# Patient Record
Sex: Male | Born: 1985 | Race: White | Hispanic: No | Marital: Single | Smoking: Never smoker
Health system: Southern US, Community
[De-identification: ages and names within clinical notes are randomized; demographics above are authoritative.]

## PROBLEM LIST (undated history)

## (undated) ENCOUNTER — Encounter: Attending: Gastroenterology | Primary: Gastroenterology

## (undated) ENCOUNTER — Ambulatory Visit

## (undated) ENCOUNTER — Ambulatory Visit: Payer: PRIVATE HEALTH INSURANCE

## (undated) ENCOUNTER — Encounter

## (undated) ENCOUNTER — Ambulatory Visit: Payer: BLUE CROSS/BLUE SHIELD

## (undated) ENCOUNTER — Telehealth

## (undated) ENCOUNTER — Encounter: Payer: PRIVATE HEALTH INSURANCE | Attending: Gastroenterology | Primary: Gastroenterology

## (undated) ENCOUNTER — Encounter
Attending: Student in an Organized Health Care Education/Training Program | Primary: Student in an Organized Health Care Education/Training Program

## (undated) ENCOUNTER — Ambulatory Visit
Payer: BLUE CROSS/BLUE SHIELD | Attending: Student in an Organized Health Care Education/Training Program | Primary: Student in an Organized Health Care Education/Training Program

## (undated) ENCOUNTER — Ambulatory Visit: Payer: PRIVATE HEALTH INSURANCE | Attending: Gastroenterology | Primary: Gastroenterology

## (undated) ENCOUNTER — Telehealth: Attending: Gastroenterology | Primary: Gastroenterology

## (undated) ENCOUNTER — Ambulatory Visit: Payer: Medicaid (Managed Care) | Attending: Family Medicine | Primary: Family Medicine

## (undated) DIAGNOSIS — K5010 Crohn's disease of large intestine without complications: Secondary | ICD-10-CM

## (undated) HISTORY — DX: Crohn's disease of large intestine without complications: K50.10

---

## 2017-05-04 ENCOUNTER — Ambulatory Visit: Payer: BLUE CROSS/BLUE SHIELD | Admitting: Gastroenterology

## 2017-05-04 ENCOUNTER — Encounter: Payer: Self-pay | Admitting: Gastroenterology

## 2017-05-04 ENCOUNTER — Encounter (INDEPENDENT_AMBULATORY_CARE_PROVIDER_SITE_OTHER): Payer: Self-pay

## 2017-05-04 ENCOUNTER — Other Ambulatory Visit
Admission: RE | Admit: 2017-05-04 | Discharge: 2017-05-04 | Disposition: A | Discharge status: Home / Self Care | Payer: BLUE CROSS/BLUE SHIELD | Source: Ambulatory Visit | Attending: Gastroenterology | Admitting: Gastroenterology

## 2017-05-04 VITALS — BP 110/75 | HR 66 | Temp 97.5°F | Ht 74.0 in | Wt 218.2 lb

## 2017-05-04 DIAGNOSIS — Z114 Encounter for screening for human immunodeficiency virus [HIV]: Secondary | ICD-10-CM

## 2017-05-04 DIAGNOSIS — K529 Noninfective gastroenteritis and colitis, unspecified: Secondary | ICD-10-CM | POA: Diagnosis not present

## 2017-05-04 DIAGNOSIS — R1084 Generalized abdominal pain: Secondary | ICD-10-CM

## 2017-05-04 DIAGNOSIS — A0472 Enterocolitis due to Clostridium difficile, not specified as recurrent: Secondary | ICD-10-CM

## 2017-05-04 DIAGNOSIS — K523 Indeterminate colitis: Secondary | ICD-10-CM | POA: Insufficient documentation

## 2017-05-04 DIAGNOSIS — Z79899 Other long term (current) drug therapy: Secondary | ICD-10-CM | POA: Insufficient documentation

## 2017-05-04 LAB — CBC WITH DIFFERENTIAL/PLATELET
Basophils Absolute: 0.1 10*3/uL (ref 0–0.1)
Basophils Relative: 2 %
Eosinophils Absolute: 0.1 10*3/uL (ref 0–0.7)
Eosinophils Relative: 2 %
HEMATOCRIT: 43.9 % (ref 40.0–52.0)
HEMOGLOBIN: 14.5 g/dL (ref 13.0–18.0)
LYMPHS ABS: 1.2 10*3/uL (ref 1.0–3.6)
LYMPHS PCT: 19 %
MCH: 31.4 pg (ref 26.0–34.0)
MCHC: 33 g/dL (ref 32.0–36.0)
MCV: 95.2 fL (ref 80.0–100.0)
MONOS PCT: 8 %
Monocytes Absolute: 0.5 10*3/uL (ref 0.2–1.0)
NEUTROS ABS: 4.5 10*3/uL (ref 1.4–6.5)
NEUTROS PCT: 69 %
Platelets: 376 10*3/uL (ref 150–440)
RBC: 4.61 MIL/uL (ref 4.40–5.90)
RDW: 13.7 % (ref 11.5–14.5)
WBC: 6.5 10*3/uL (ref 3.8–10.6)

## 2017-05-04 LAB — COMPREHENSIVE METABOLIC PANEL
ALK PHOS: 57 U/L (ref 38–126)
ALT: 12 U/L — AB (ref 17–63)
AST: 18 U/L (ref 15–41)
Albumin: 3.9 g/dL (ref 3.5–5.0)
Anion gap: 9 (ref 5–15)
BUN: 11 mg/dL (ref 6–20)
CALCIUM: 9.1 mg/dL (ref 8.9–10.3)
CO2: 26 mmol/L (ref 22–32)
CREATININE: 0.87 mg/dL (ref 0.61–1.24)
Chloride: 104 mmol/L (ref 101–111)
GFR calc Af Amer: >60 mL/min (ref >60)
Glucose, Bld: 81 mg/dL (ref 65–99)
Potassium: 3.8 mmol/L (ref 3.5–5.1)
Sodium: 139 mmol/L (ref 135–145)
Total Bilirubin: 0.9 mg/dL (ref 0.3–1.2)
Total Protein: 7.3 g/dL (ref 6.5–8.1)

## 2017-05-04 LAB — C-REACTIVE PROTEIN: CRP: <0.8 mg/dL (ref <1.0)

## 2017-05-04 MED ORDER — FIDAXOMICIN 200 MG PO TABS: 200.0000 mg | ORAL_TABLET | Freq: Two times a day (BID) | ORAL | 0 refills | Status: AC

## 2017-05-04 NOTE — Progress Notes (Signed)
Jorge Bellows MD, MRCP(U.K) [address]  [place], [place] [address]  Main: [phone]  Fax: [phone]   Gastroenterology Consultation  Referring Provider:     Orlena Sheldon, MD Primary Care Physician:  Jorge Clark Primary Gastroenterologist:  Dr. Jonathon Clark  Reason for Consultation:     Recurrent C diff colitis        HPI:   Jorge Clark is a 31 y.o. y/o male   Past Medical History:  Diagnosis Date  . 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  He had over 200 pages of records to be reviewed and he also has an office note from April 2018 available on epic.  By himself he is well aware of his history and brought in all his records into his office visit and we discussed in detail.  His story goes that after graduation in December 2011- was 315 lbs , played college football, went to [place] in 2013 , was working on [employer] , half way through being out there, had some gut issues, diarrhea, non bloody . When he returned to [facility]. Saw his doctor at [facility] and he was treated with a round of metronidazole, symptoms resolved. Once he went off metronidazole the symptoms returned, had a second course of the antibiotics, symptoms resolved . Subsequently was found to have an elevated [place], had a colonoscopy which showed mild ulcerations in the colon and was placed on Asacol (2013). From there he says it went "downhill", symptoms were worse, Then tried remicaid, after a few rounds didn't work.  I do note that at one point he did have antibodies against Remicade. Was advised to see a physician at [facility], [place] the physicians there - there was a concern that he may have Crohn due to response to antibiotics. Increased dose of Remicaid, symptoms improved, he was on steroids and it got better.  Since he developedntibodies was switched to Humira (2014-2015). Was restarted on Metronidazole continuously  from 2015-2016 . Did well - was physically very active at that time. Spring of 2017 started having issues again .Started  Entyvio in 08/2016 because his liver function tests rose on Humira and at some point he was also treated with Uceris and methotrexate-he took [facility]  for a few months - no improvement . Marland Kitchen Did not have a colonoscopy after starting Entyvio.  Review of his records in April 2018 suggests that he was recommended a colonoscopy at that point of time. Stopped Entyvio 09/2016 - 3-4 rounds of treatment taken .  Subsequently was suggested to have a colectomy and as per his last office note it was strongly suggested that he undergo surgery as they felt he had refractory disease. At that time he was also doing a 24 hour endurance race. He said that he was feeling well - changed his diet , says that he felt his bowel movements were getting better. Surgery was put off. Prior to first episode of C diff - had 4 tests which were negative. Saw another GI at [facility]- had a repeat C diff test - was negative but was treated with Vancoymycin- symptoms got worse off the antibiotics. Went off Vancomycin for 2 weeks , back on vacomycin with a slow taper , then felt better, was then put on Flagyl felt better- was having formed stools. Attempted to go off the antibiotics, had worsening of symptoms to this date.  Since Entyvio not been on any treatment - last colonoscopy was 2017    His records from his last colonoscopy in May 2017 performed at [facility] suggest a diagnosis of ulcerative pancolitis and there was moderate diffuse inflammation of the entire examined colon biopsies showed moderate active chronic enterocolitis with focal erosion and reactive epithelial changes TB QuantiFERON test was negative in March 2017 hepatitis C virus antibody was negative hepatitis B core total antibody was negative hepatitis B surface antibody was reactive hepatitis B surface antigen was negative hemoglobin is 14.3 g. Biopsies in 2013  from an upper endoscopy show normal mucosa of the duodenum and chronic gastritis of the stomach.  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  A CT scan in 2013 demonstrated mild thickening of the terminal ileum and thickening of the colonic wall and could be seen in features of Crohn's disease or ulcerative colitis with backwash ileitis.  I do note that in 2017 there is has been on his medical records that he has been on methotrexate once a week subcutaneous injection as well as Uceris.  Records on epic from [facility] in April 2018 when he last saw his gastroenterologist   Present:   Having 10-12 bowel movements a day , no blood, no cramping , wakes up at night with a bowel movement .  Never had a MRE.   September this year was positive for C diff . Was not treated.    History reviewed. No pertinent surgical history.  Prior to Admission medications   Medication Sig Start Date End Date Taking? Authorizing Provider  Adalimumab (HUMIRA [place]) Inject  beneath the skin.    [provider]  Budesonide ER (UCERIS) 9 MG TB24 Take 1 Tab by Mouth. 05/26/15   [provider]  Budesonide ER 9 MG TB24 Take 9 mg by mouth. 07/22/15   [provider]  fidaxomicin (DIFICID) 200 MG TABS tablet Take 1 tablet (200 mg total) by mouth 2 (two) times daily for 14 days. 05/04/17 05/18/17  Jorge Bellows, MD  folic acid (FOLVITE) 1 MG tablet Take 1 mg by Mouth Once a Day. 07/22/15   [provider]  Methotrexate Sodium (METHOTREXATE, PF,) 200 MG/8ML injection INJECT 1 ML (25 MG TOTAL) UNDER THE SKIN ONCE A WEEK. DISPENSE 3ML SYRINGE #4 AND 25G 5/8" NEEDLE #4 04/10/15   [provider]  Multiple Vitamins-Minerals (MULTIVITAMIN PO) Take  by Mouth.    [provider]    History reviewed. No pertinent family  history.   Social History   Tobacco Use  . Smoking status: Never Smoker  . Smokeless tobacco: Never Used  Substance Use Topics  . Alcohol use: No    Frequency: Never  . Drug use: No    Allergies as of 05/04/2017  . (No Known Allergies)    Review of Systems:    All systems reviewed and negative except where noted in HPI.   Physical Exam:  BP 110/75 (BP Location: Left Arm, Patient Position: Sitting, Cuff Size: Large)   Pulse 66   Temp (!) 97.5 F (36.4 C) (Oral)   Ht 6' 2"  (1.88 m)   Wt 218 lb 3.2 oz (99 kg)   BMI 28.02 kg/m  No LMP for male patient. Psych:  Alert and cooperative. Normal mood and affect. General:   Alert,  Well-developed, well-nourished, pleasant and cooperative in  NAD Head:  Normocephalic and atraumatic. Eyes:  Sclera clear, no icterus.   Conjunctiva pink. Ears:  Normal auditory acuity. Nose:  No deformity, discharge, or lesions. Mouth:  No deformity or lesions,oropharynx pink & moist. Neck:  Supple; no masses or thyromegaly. Lungs:  Respirations even and unlabored.  Clear throughout to auscultation.   No wheezes, crackles, or rhonchi. No acute distress. Heart:  Regular rate and rhythm; no murmurs, clicks, rubs, or gallops. Abdomen:  Normal bowel sounds.  No bruits.  Soft, non-tender and non-distended without masses, hepatosplenomegaly or hernias noted.  No guarding or rebound tenderness.    Msk:  Symmetrical without gross deformities. Good, equal movement & strength bilaterally. Pulses:  Normal pulses noted. Extremities:  No clubbing or edema.  No cyanosis. Neurologic:  Alert and oriented x3;  grossly normal neurologically. Skin:  Intact without significant lesions or rashes. No jaundice. Lymph Nodes:  No significant cervical adenopathy. Psych:  Alert and cooperative. Normal mood and affect.  Imaging Studies: No results found.  Assessment and Plan:   Jorge Clark is a 31 y.o. y/o male here today self-referred for evaluation of recurrent C.  difficile colitis and ulcerative colitis.  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  It appears for a short time between March and April 2017 he received a few doses of Entyvio and for reasons unclear that it was stopped .   Based on the information is unclear whether the true failure of Entyvio or he had coexisting IBD-like symptoms such as bacterial overgrowth which caused significant symptoms of bloating diarrhea and abdominal discomfort.  He has responded to antibiotics on multiple occasions in the past, had several rounds of Flagyl and his symptoms have been predominant really bloating cramping and frequency of bowel movements he really has not had any rectal bleeding it is very likely possible that he does have chronic small bowel bacterial overgrowth syndrome along with ulcerative pancolitis.  He has been treated for C. difficile colitis on 2 occasions once in 2017 and once in 2018 he has subsequently had a repeat C. difficile test that has become positive in September 2018 and at present is having symptoms from moderate to severe in severity.  It is possible that his symptoms are a combination of small bowel bacterial overgrowth C. difficile colitis and possibly ulcerative colitis.  At this point of time it is hard to say which of these individual conditions is contributing to  total symptom burden.    Plan  1. Stop Probiotics as it can cause bloating and diarrhea 2. Course of Dificid to empirically treat C. difficile colitis, He also may benefit long-term from treatment with a new medication which is available to prevent the occurrence of C. difficile which is called Zimplava 3. Stool PCR and c diff 4. CBC,CMP,CRP,fecal lactoferrin , HIV,  Hepatitis A,B,C 5. MRE to determine if there is any small bowel inflammation and determine the level of inflammation of the colon 6. TBquantiferon 7.  I will see him back in 2 weeks standard at a mine timing of his colonoscopy as to assess the baseline of his mucosa to determine what poor proportion of his symptoms is from his colitis 8.  Based on his history it is  very likely possible that he has refractory disease and I have explained to him in detail that surgery may very likely be a possibility if this is all truly due to his inflammatory bowel disease I did mention to him that if surgery would be considered I would rather have it done electively rather than more emergently.  Based on the above tests I will get him in touch with the colorectal surgeon that he establishes a relationship with so that surgery were to be needed in the future it would be easier for him to go through it relatively soon a fashion.  This visit of 90 mins , over 50% of time  (60 minutes) was spent in counseling, coordinating care with the patient as mentioned above.       Follow up in 3 weeks   Dr Jorge Bellows MD,MRCP(U.K)

## 2017-05-05 ENCOUNTER — Other Ambulatory Visit
Admission: RE | Admit: 2017-05-05 | Discharge: 2017-05-05 | Disposition: A | Discharge status: Home / Self Care | Payer: BLUE CROSS/BLUE SHIELD | Source: Ambulatory Visit | Attending: Gastroenterology | Admitting: Gastroenterology

## 2017-05-05 DIAGNOSIS — R1084 Generalized abdominal pain: Secondary | ICD-10-CM | POA: Insufficient documentation

## 2017-05-05 LAB — GASTROINTESTINAL PANEL BY PCR, STOOL (REPLACES STOOL CULTURE)

## 2017-05-05 LAB — HEPATITIS B SURFACE ANTIBODY, QUANTITATIVE: HEPATITIS B-POST: 397.1 m[IU]/mL

## 2017-05-05 LAB — HIV ANTIBODY (ROUTINE TESTING W REFLEX): HIV Screen 4th Generation wRfx: NONREACTIVE

## 2017-05-05 LAB — HEPATITIS C ANTIBODY: HCV Ab: <0.1 s/co ratio (ref 0.0–0.9)

## 2017-05-05 LAB — HEPATITIS A ANTIBODY, TOTAL: Hep A Total Ab: NEGATIVE

## 2017-05-05 LAB — HEPATITIS B SURFACE ANTIGEN: HEP B S AG: NEGATIVE

## 2017-05-05 LAB — C DIFFICILE QUICK SCREEN W PCR REFLEX
C Diff antigen: NEGATIVE
C Diff interpretation: NOT DETECTED
C Diff toxin: NEGATIVE

## 2017-05-06 ENCOUNTER — Telehealth: Payer: Self-pay

## 2017-05-06 NOTE — Telephone Encounter (Signed)
Advised patient of results per Dr. Vicente Males.    - Inform the antigen and c diff toxin are negative- no benefit of deficid.  Pt to have 2 week follow-up to evaluate mucosa for colitis

## 2017-05-07 LAB — QUANTIFERON-TB GOLD PLUS (RQFGPL)
QUANTIFERON NIL VALUE: 0.02 [IU]/mL
QUANTIFERON TB2 AG VALUE: 0.02 [IU]/mL
QuantiFERON TB1 Ag Value: 0.01 IU/mL

## 2017-05-07 LAB — QUANTIFERON-TB GOLD PLUS: QUANTIFERON-TB GOLD PLUS: NEGATIVE

## 2017-05-10 ENCOUNTER — Encounter: Payer: Self-pay | Admitting: Gastroenterology

## 2017-05-10 ENCOUNTER — Telehealth: Payer: Self-pay | Admitting: Gastroenterology

## 2017-05-10 LAB — CALPROTECTIN, FECAL: Calprotectin, Fecal: 530 ug/g — ABNORMAL HIGH (ref 0–120)

## 2017-05-10 NOTE — Telephone Encounter (Signed)
Patient called wanting to know why his MRI got canceled. Please call and r/s.

## 2017-05-10 NOTE — Telephone Encounter (Signed)
[facility] called and needs Cdiff labs plus the patient needs try Vancomycin w/ in the past 30 days. Please call at [phone] Gae Bon. Please call STAT!!!

## 2017-05-11 ENCOUNTER — Ambulatory Visit: Payer: BLUE CROSS/BLUE SHIELD

## 2017-05-13 ENCOUNTER — Telehealth: Payer: Self-pay | Admitting: Gastroenterology

## 2017-05-13 NOTE — Telephone Encounter (Signed)
Patient left a voice message that he would like to talk to you. No reason given.

## 2017-05-16 ENCOUNTER — Other Ambulatory Visit: Payer: Self-pay

## 2017-05-16 DIAGNOSIS — R1084 Generalized abdominal pain: Secondary | ICD-10-CM

## 2017-05-16 MED ORDER — NA SULFATE-K SULFATE-MG SULF 17.5-3.13-1.6 GM/177ML PO SOLN: 1.0000 | Freq: Once | ORAL | 0 refills | Status: AC

## 2017-05-18 ENCOUNTER — Ambulatory Visit: Payer: BLUE CROSS/BLUE SHIELD | Admitting: Gastroenterology

## 2017-05-20 ENCOUNTER — Ambulatory Visit
Admission: RE | Admit: 2017-05-20 | Payer: BLUE CROSS/BLUE SHIELD | Source: Ambulatory Visit | Admitting: Gastroenterology

## 2017-05-20 ENCOUNTER — Ambulatory Visit
Admission: RE | Admit: 2017-05-20 | Discharge: 2017-05-20 | Disposition: A | Discharge status: Home / Self Care | Payer: BLUE CROSS/BLUE SHIELD | Source: Ambulatory Visit | Attending: Gastroenterology | Admitting: Gastroenterology

## 2017-05-20 ENCOUNTER — Other Ambulatory Visit: Payer: Self-pay

## 2017-05-20 DIAGNOSIS — R1084 Generalized abdominal pain: Secondary | ICD-10-CM | POA: Insufficient documentation

## 2017-05-20 MED ORDER — GADOBENATE DIMEGLUMINE 529 MG/ML IV SOLN
20.0000 mL | Freq: Once | INTRAVENOUS | Status: AC | PRN
  Administered 2017-05-20: 20 mL via INTRAVENOUS

## 2017-05-20 MED ORDER — PEG 3350-KCL-NA BICARB-NACL 420 G PO SOLR: 4000.0000 mL | Freq: Once | ORAL | 0 refills | Status: AC

## 2017-05-20 NOTE — Telephone Encounter (Signed)
Sent MyChart message requesting the office to contact at [facility] for the colonoscopy prep.   Since this message is confusing I sent the prep to the pharmacy and responded to the Message with that information and the phone number to call concerning the time of procedure at [facility].

## 2017-05-23 ENCOUNTER — Ambulatory Visit: Payer: BLUE CROSS/BLUE SHIELD | Admitting: Certified Registered"

## 2017-05-23 ENCOUNTER — Encounter
Admission: RE | Disposition: A | Discharge status: Home / Self Care | Payer: Self-pay | Source: Ambulatory Visit | Attending: Gastroenterology

## 2017-05-23 ENCOUNTER — Ambulatory Visit
Admission: RE | Admit: 2017-05-23 | Discharge: 2017-05-23 | Disposition: A | Discharge status: Home / Self Care | Payer: BLUE CROSS/BLUE SHIELD | Source: Ambulatory Visit | Attending: Gastroenterology | Admitting: Gastroenterology

## 2017-05-23 DIAGNOSIS — Z79899 Other long term (current) drug therapy: Secondary | ICD-10-CM | POA: Diagnosis not present

## 2017-05-23 DIAGNOSIS — K5100 Ulcerative (chronic) pancolitis without complications: Secondary | ICD-10-CM | POA: Diagnosis not present

## 2017-05-23 DIAGNOSIS — K635 Polyp of colon: Secondary | ICD-10-CM | POA: Insufficient documentation

## 2017-05-23 DIAGNOSIS — K5190 Ulcerative colitis, unspecified, without complications: Secondary | ICD-10-CM | POA: Insufficient documentation

## 2017-05-23 DIAGNOSIS — K6289 Other specified diseases of anus and rectum: Secondary | ICD-10-CM | POA: Diagnosis not present

## 2017-05-23 DIAGNOSIS — R1084 Generalized abdominal pain: Secondary | ICD-10-CM

## 2017-05-23 HISTORY — PX: COLONOSCOPY WITH PROPOFOL: SHX5780

## 2017-05-23 SURGERY — COLONOSCOPY WITH PROPOFOL
Anesthesia: General

## 2017-05-23 MED ORDER — GLYCOPYRROLATE 0.2 MG/ML IJ SOLN
INTRAMUSCULAR | Status: AC
  Filled 2017-05-23: qty 1

## 2017-05-23 MED ORDER — MIDAZOLAM HCL 5 MG/5ML IJ SOLN
INTRAMUSCULAR | Status: DC | PRN
  Administered 2017-05-23: 2 mg via INTRAVENOUS

## 2017-05-23 MED ORDER — PROPOFOL 10 MG/ML IV BOLUS
INTRAVENOUS | Status: DC | PRN
  Administered 2017-05-23: 30 mg via INTRAVENOUS
  Administered 2017-05-23: 70 mg via INTRAVENOUS
  Administered 2017-05-23: 50 mg via INTRAVENOUS

## 2017-05-23 MED ORDER — SODIUM CHLORIDE 0.9 % IV SOLN
INTRAVENOUS | Status: DC
  Administered 2017-05-23: 1000 mL via INTRAVENOUS

## 2017-05-23 MED ORDER — PROPOFOL 500 MG/50ML IV EMUL
INTRAVENOUS | Status: DC | PRN
  Administered 2017-05-23: 120 ug/kg/min via INTRAVENOUS

## 2017-05-23 MED ORDER — LIDOCAINE 2% (20 MG/ML) 5 ML SYRINGE
INTRAMUSCULAR | Status: DC | PRN
  Administered 2017-05-23: 25 mg via INTRAVENOUS

## 2017-05-23 MED ORDER — MIDAZOLAM HCL 2 MG/2ML IJ SOLN
INTRAMUSCULAR | Status: AC
  Filled 2017-05-23: qty 2

## 2017-05-23 MED ORDER — GLYCOPYRROLATE 0.2 MG/ML IJ SOLN
INTRAMUSCULAR | Status: DC | PRN
  Administered 2017-05-23: 0.2 mg via INTRAVENOUS

## 2017-05-23 MED ORDER — LIDOCAINE HCL (PF) 1 % IJ SOLN
INTRAMUSCULAR | Status: AC
  Administered 2017-05-23: 0.3 mL via INTRADERMAL
  Filled 2017-05-23: qty 2

## 2017-05-23 MED ORDER — LIDOCAINE HCL (PF) 1 % IJ SOLN
2.0000 mL | Freq: Once | INTRAMUSCULAR | Status: AC
  Administered 2017-05-23: 0.3 mL via INTRADERMAL

## 2017-05-23 NOTE — Anesthesia Post-op Follow-up Note (Signed)
Anesthesia QCDR form completed.        

## 2017-05-23 NOTE — Anesthesia Postprocedure Evaluation (Signed)
Anesthesia Post Note  Patient: Jorge Clark  Procedure(s) Performed: COLONOSCOPY WITH PROPOFOL (N/A )  Patient location during evaluation: PACU Anesthesia Type: General Level of consciousness: awake and alert Pain management: pain level controlled Vital Signs Assessment: post-procedure vital signs reviewed and stable Respiratory status: spontaneous breathing, nonlabored ventilation, respiratory function stable and patient connected to nasal cannula oxygen Cardiovascular status: blood pressure returned to baseline and stable Postop Assessment: no apparent nausea or vomiting Anesthetic complications: no     Last Vitals:  Vitals:   05/23/17 0949 05/23/17 0953  BP:  105/73  Pulse: (!) 52 (!) 45  Resp: 16 10  Temp:    SpO2: 100% 100%    Last Pain:  Vitals:   05/23/17 0953  TempSrc:   PainSc: 0-No pain                 Molli Barrows

## 2017-05-23 NOTE — Op Note (Signed)
[facility] Gastroenterology Patient Name: Jorge Clark Procedure Date: 05/23/2017 8:55 AM MRN: [id number] Account #: [id number] Date of Birth: 04/11/1986 Admit Type: Outpatient Age: 31 Room: [facility] ENDO ROOM 4 Gender: Male Note Status: Finalized Procedure:            Colonoscopy Indications:          Chronic ulcerative pancolitis Providers:            Jonathon Bellows MD, MD Referring MD:         No Local Md, MD (Referring MD) Medicines:            Monitored Anesthesia Care Complications:        No immediate complications. Procedure:            Pre-Anesthesia Assessment:                       - ASA Grade Assessment: III - A patient with severe                        systemic disease.                       After obtaining informed consent, the colonoscope was                        passed under direct vision. Throughout the procedure,                        the patient's blood pressure, pulse, and oxygen                        saturations were monitored continuously. The                        Colonoscope was introduced through the anus and                        advanced to the the terminal ileum. The colonoscopy was                        performed with ease. The patient tolerated the                        procedure well. The quality of the bowel preparation                        was good. Findings:      The terminal ileum appeared normal. Biopsies were taken with a cold       forceps for histology.      Inflammation characterized by congestion (edema), erosions, erythema,       loss of vascularity and mucus was found in a continuous and       circumferential pattern from the rectum to the cecum. This was moderate       in severity. Biopsies were taken with a cold forceps for histology.      A 10 mm polyp was found in the sigmoid colon. The polyp was sessile.       Biopsies were taken with a cold forceps for histology. Impression:           - The examined  portion of the ileum  was normal.                        Biopsied.                       - Pancolitis ulcerative colitis. Inflammation was found                        from the rectum to the cecum. This was moderate in                        severity. Biopsied.                       - One 10 mm polyp in the sigmoid colon. Biopsied. Recommendation:       - Discharge patient to home (with escort).                       - Resume previous diet.                       - Continue present medications.                       - Await pathology results.                       - Return to my office in 2 weeks. Procedure Code(s):    --- Professional ---                       [phone], Colonoscopy, flexible; with biopsy, single or                        multiple Diagnosis Code(s):    --- Professional ---                       K51.00, Ulcerative (chronic) pancolitis without                        complications                       D12.5, Benign neoplasm of sigmoid colon CPT copyright 2016 American Medical Association. All rights reserved. The codes documented in this report are preliminary and upon coder review may  be revised to meet current compliance requirements. Jonathon Bellows, MD Jonathon Bellows MD, MD 05/23/2017 9:24:21 AM This report has been signed electronically. Number of Addenda: 0 Note Initiated On: 05/23/2017 8:55 AM Total Procedure Duration: 0 hours 20 minutes 51 seconds       [facility]

## 2017-05-23 NOTE — Anesthesia Preprocedure Evaluation (Signed)
Anesthesia Evaluation  Patient identified by MRN, date of birth, ID band Patient awake    Reviewed: Allergy & Precautions, H&P , NPO status , Patient's Chart, lab work & pertinent test results, reviewed documented beta blocker date and time   Airway Mallampati: II   Neck ROM: full    Dental  (+) Poor Dentition, Teeth Intact   Pulmonary neg pulmonary ROS, neg recent URI,    Pulmonary exam normal        Cardiovascular Exercise Tolerance: Good negative cardio ROS Normal cardiovascular exam Rhythm:regular Rate:Normal     Neuro/Psych negative neurological ROS  negative psych ROS   GI/Hepatic negative GI ROS, Neg liver ROS,   Endo/Other  negative endocrine ROS  Renal/GU negative Renal ROS  negative genitourinary   Musculoskeletal   Abdominal   Peds  Hematology negative hematology ROS (+)   Anesthesia Other Findings Past Medical History: No date: Crohn's colitis ([place]) No past surgical history on file.   Reproductive/Obstetrics negative OB ROS                             Anesthesia Physical Anesthesia Plan  ASA: II  Anesthesia Plan: General   Post-op Pain Management:    Induction:   PONV Risk Score and Plan:   Airway Management Planned:   Additional Equipment:   Intra-op Plan:   Post-operative Plan:   Informed Consent: I have reviewed the patients History and Physical, chart, labs and discussed the procedure including the risks, benefits and alternatives for the proposed anesthesia with the patient or authorized representative who has indicated his/her understanding and acceptance.   Dental Advisory Given  Plan Discussed with: CRNA  Anesthesia Plan Comments:         Anesthesia Quick Evaluation

## 2017-05-23 NOTE — H&P (Signed)
     Jonathon Bellows, MD [address] [place], [place], [place], [address] [address], [place], [place], [place], [address] Phone: [phone]  Fax: [phone]  Primary Care Physician:  Evette Cristal A   Pre-Procedure History & Physical: HPI:  Jorge Clark is a 31 y.o. male is here for an colonoscopy.   Past Medical History:  Diagnosis Date  . Crohn's colitis ([place])     No past surgical history on file.  Prior to Admission medications   Medication Sig Start Date End Date Taking? Authorizing Provider  Adalimumab (HUMIRA [place]) Inject  beneath the skin.    [provider]  Budesonide ER (UCERIS) 9 MG TB24 Take 1 Tab by Mouth. 05/26/15   [provider]  Budesonide ER 9 MG TB24 Take 9 mg by mouth. 07/22/15   [provider]  folic acid (FOLVITE) 1 MG tablet Take 1 mg by Mouth Once a Day. 07/22/15   [provider]  Methotrexate Sodium (METHOTREXATE, PF,) 200 MG/8ML injection INJECT 1 ML (25 MG TOTAL) UNDER THE SKIN ONCE A WEEK. DISPENSE 3ML SYRINGE #4 AND 25G 5/8" NEEDLE #4 04/10/15   [provider]  Multiple Vitamins-Minerals (MULTIVITAMIN PO) Take  by Mouth.    [provider]    Allergies as of 05/17/2017  . (No Known Allergies)    No family history on file.  Social History   Socioeconomic History  . Marital status: Single    Spouse name: Not on file  . Number of children: Not on file  . Years of education: Not on file  . Highest education level: Not on file  Social Needs  . Financial resource strain: Not on file  . Food insecurity - worry: Not on file  . Food insecurity - inability: Not on file  . Transportation needs - medical: Not on file  . Transportation needs - non-medical: Not on file  Occupational History  . Not on file  Tobacco Use  . Smoking status: Never Smoker  . Smokeless tobacco: Never Used  Substance and Sexual Activity  . Alcohol use: No    Frequency: Never  . Drug use: No  . Sexual  activity: Yes  Other Topics Concern  . Not on file  Social History Narrative  . Not on file    Review of Systems: See HPI, otherwise negative ROS  Physical Exam: There were no vitals taken for this visit. General:   Alert,  pleasant and cooperative in NAD Head:  Normocephalic and atraumatic. Neck:  Supple; no masses or thyromegaly. Lungs:  Clear throughout to auscultation, normal respiratory effort.    Heart:  +S1, +S2, Regular rate and rhythm, No edema. Abdomen:  Soft, nontender and nondistended. Normal bowel sounds, without guarding, and without rebound.   Neurologic:  Alert and  oriented x4;  grossly normal neurologically.  Impression/Plan: Jorge Clark is here for an colonoscopy to be performed for evaluation of ulcerative colitis.    Risks, benefits, limitations, and alternatives regarding  colonoscopy have been reviewed with the patient.  Questions have been answered.  All parties agreeable.   Jonathon Bellows, MD  05/23/2017, 8:33 AM

## 2017-05-23 NOTE — Transfer of Care (Signed)
Immediate Anesthesia Transfer of Care Note  Patient: Jorge Clark  Procedure(s) Performed: COLONOSCOPY WITH PROPOFOL (N/A )  Patient Location: Endoscopy Unit  Anesthesia Type:General  Level of Consciousness: awake and alert   Airway & Oxygen Therapy: Patient Spontanous Breathing and Patient connected to nasal cannula oxygen  Post-op Assessment: Report given to RN and Post -op Vital signs reviewed and stable  Post vital signs: Reviewed  Last Vitals:  Vitals:   05/23/17 0835 05/23/17 0928  BP: 128/81 103/62  Pulse: 68 77  Resp: 16 15  Temp: (!) 35.8 C (!) 36.1 C  SpO2: 97% 100%    Last Pain:  Vitals:   05/23/17 0835  TempSrc: Tympanic         Complications: No apparent anesthesia complications

## 2017-05-24 ENCOUNTER — Encounter: Payer: Self-pay | Admitting: Gastroenterology

## 2017-05-25 LAB — SURGICAL PATHOLOGY

## 2017-05-26 ENCOUNTER — Encounter: Payer: Self-pay | Admitting: Gastroenterology

## 2017-06-01 ENCOUNTER — Telehealth: Payer: Self-pay

## 2017-06-01 ENCOUNTER — Ambulatory Visit: Payer: BLUE CROSS/BLUE SHIELD | Admitting: Gastroenterology

## 2017-06-01 NOTE — Telephone Encounter (Signed)
-----   Message from Jonathon Bellows, MD sent at 05/31/2017  5:29 PM EST ----- Sharyn Lull - I think patient may already have the results- he has moderate to severe ulcerative colitis based on these results- will discuss next steps on 06/02/17

## 2017-06-01 NOTE — Telephone Encounter (Signed)
Pt has been informed that he has moderate to severe ulcerative colitis and Dr. Vicente Males will discuss the next steps at [place] visit.  Thanks [employer]

## 2017-06-02 ENCOUNTER — Encounter: Payer: Self-pay | Admitting: Gastroenterology

## 2017-06-02 ENCOUNTER — Ambulatory Visit (INDEPENDENT_AMBULATORY_CARE_PROVIDER_SITE_OTHER): Payer: BLUE CROSS/BLUE SHIELD | Admitting: Gastroenterology

## 2017-06-02 VITALS — BP 115/78 | HR 70 | Temp 98.2°F | Ht 74.0 in | Wt 224.6 lb

## 2017-06-02 DIAGNOSIS — K580 Irritable bowel syndrome with diarrhea: Secondary | ICD-10-CM | POA: Diagnosis not present

## 2017-06-02 DIAGNOSIS — K5100 Ulcerative (chronic) pancolitis without complications: Secondary | ICD-10-CM | POA: Diagnosis not present

## 2017-06-02 MED ORDER — RIFAXIMIN 550 MG PO TABS: 550.0000 mg | ORAL_TABLET | Freq: Three times a day (TID) | ORAL | 0 refills | Status: DC

## 2017-06-02 NOTE — Progress Notes (Signed)
xifaxan  Jonathon Bellows MD, MRCP(U.K) [address]  [place]  [place], [place] [address]  Main: [phone]  Fax: [phone]   Primary Care Physician: Orlena Sheldon  Primary Gastroenterologist:  Dr. Jonathon Bellows   Chief Complaint  Patient presents with  . Ulcerative Colitis    follow up    HPI: Jorge Clark is a 31 y.o. male    Summary of history : He saw me for his first visit on 05/04/17 :   He had over 200 pages of records to be reviewed and he also has an office note from April 2018 available on epic.  By himself he is well aware of his history and brought in all his records into his office visit and we discussed in detail.  His story goes that after graduation in December 2011- was 315 lbs , played college football, went to [place] in 2013 , was working on [employer] , half way through being out there, had some gut issues, diarrhea, non bloody . When he returned to [facility] Saw his doctor at [facility] and he was treated with a round of metronidazole, symptoms resolved. Once he went off metronidazole the symptoms returned, had a second course of the antibiotics, symptoms resolved . Subsequently was found to have an elevated [place], had a colonoscopy which showed mild ulcerations in the colon and was placed on Asacol (2013). From there he says it went "downhill", symptoms were worse, Then tried remicaid, after a few rounds didn't work.  I do note that at one point he did have antibodies against Remicade. Was advised to see a physician at [facility], [place] the physicians there - there was a concern that he may have Crohn due to response to antibiotics. Increased dose of Remicaid, symptoms improved, he was on steroids and it got better.  Since he developedntibodies was switched to Humira (2014-2015). Was restarted on Metronidazole continuously from 2015-2016 . Did well - was physically very active at that time. Spring of 2017 started having issues again .Started  Entyvio in 08/2016 because his liver  function tests rose on Humira and at some point he was also treated with Uceris and methotrexate-he took [facility]  for a few months - no improvement . Marland Kitchen Did not have a colonoscopy after starting Entyvio.  Review of his records in April 2018 suggests that he was recommended a colonoscopy at that point of time. Stopped Entyvio 09/2016 - 3-4 rounds of treatment taken .  Subsequently was suggested to have a colectomy and as per his last office note it was strongly suggested that he undergo surgery as they felt he had refractory disease. At that time he was also doing a 24 hour endurance race. He said that he was feeling well - changed his diet , says that he felt his bowel movements were getting better. Surgery was put off. Prior to first episode of C diff - had 4 tests which were negative. Saw another GI at [facility]- had a repeat C diff test - was negative but was treated with Vancoymycin- symptoms got worse off the antibiotics. Went off Vancomycin for 2 weeks , back on vacomycin with a slow taper , then felt better, was then put on Flagyl felt better- was having formed stools. Attempted to go off the antibiotics, had worsening of symptoms to this date. Since Entyvio not been on any treatment - last colonoscopy was 2017    His records from his last colonoscopy in May 2017 performed at [facility] suggest  a diagnosis of ulcerative pancolitis and there was moderate diffuse inflammation of the entire examined colon biopsies showed moderate active chronic enterocolitis with focal erosion and reactive epithelial changes TB QuantiFERON test was negative in March 2017 hepatitis C virus antibody was negative hepatitis B core total antibody was negative hepatitis B surface antibody was reactive hepatitis B surface antigen was negative hemoglobin is 14.3 g. Biopsies in 2013 from an upper endoscopy show normal mucosa of the duodenum and chronic gastritis of the stomach.  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  A CT scan in 2013 demonstrated mild thickening of the terminal ileum and thickening of the colonic wall and could be seen in features of Crohn's disease or ulcerative colitis with backwash ileitis.  I do note that in 2017 there is has been on his medical records that he has been on methotrexate once a week subcutaneous injection as well as Uceris.  Records on epic from [facility] in April 2018 when he last saw his gastroenterologist   Interval history   05/04/2017-  06/02/2017    05/23/17- Colonoscopy showed pancolitis-biopsies confirm moderate severe  05/20/17: MR enterogram showed normal small bowel but thickened colol difusely from rectum to cecum, no skip areas.  05/04/17- Stool C diff and PCR negative. TB quantiferon negative. CMP-normal  HCV ab ,Hep Bsag,HIV,CRP-negative.Immune to hep B.not immune to Hep A.Hb 14.5 grams.   Xifaxan helped significantly with improvement of diarrhea.   No current outpatient medications on file.   No current facility-administered medications for this visit.     Allergies as of 06/02/2017  . (No Known Allergies)    ROS:  General: Negative for anorexia, weight loss, fever, chills, fatigue, weakness. ENT: Negative for hoarseness, difficulty swallowing , nasal congestion. CV: Negative for chest pain, angina, palpitations, dyspnea on exertion, peripheral edema.  Respiratory: Negative for dyspnea at rest, dyspnea on exertion, cough, sputum, wheezing.  GI: See history of present illness. GU:  Negative for dysuria, hematuria, urinary incontinence, urinary frequency, nocturnal urination.  Endo: Negative for unusual weight change.    Physical Examination:   BP 115/78   Pulse 70   Temp 98.2 F (36.8 C) (Oral)   Ht 6' 2"  (1.88 m)   Wt 224 lb 9.6 oz (101.9 kg)   BMI 28.84 kg/m   General:  Well-nourished, well-developed in no acute distress.  Eyes: No icterus. Conjunctivae pink. Mouth: Oropharyngeal mucosa moist and pink , no lesions erythema or exudate. Lungs: Clear to auscultation bilaterally. Non-labored. Heart: Regular rate and rhythm, no murmurs rubs or gallops.  Abdomen: Bowel sounds are normal, nontender, nondistended, no hepatosplenomegaly or masses, no abdominal bruits or hernia , no rebound or guarding.   Extremities: No lower extremity edema. No clubbing or deformities. Neuro: Alert and oriented x 3.  Grossly intact. Skin: Warm and dry, no jaundice.   Psych: Alert and cooperative, normal mood and affect.   Imaging Studies: Mr Kerry Fort Abdomen W Wo Contrast  Result Date: 05/20/2017 CLINICAL DATA:  Chronic diarrhea for 6 years with suspicion for inflammatory bowel disease, prior colonoscopies at [facility] suggest ulcerative pain colitis. Chronic enteritis at the terminal ileum without active enteritis back in 2013. EXAM: MR ABDOMEN AND PELVIS WITHOUT AND WITH CONTRAST (MR ENTEROGRAPHY) TECHNIQUE: Multiplanar, multisequence MRI of the abdomen and pelvis was performed both before and during bolus administration of intravenous contrast. Negative oral contrast  VoLumen was given. CONTRAST:  25m MULTIHANCE GADOBENATE DIMEGLUMINE 529 MG/ML IV SOLN COMPARISON:  05/20/2017 FINDINGS: Despite efforts by the technologist and patient, motion artifact is present on today's exam and could not be eliminated. This reduces exam sensitivity and specificity. COMBINED FINDINGS FOR BOTH MR ABDOMEN AND PELVIS Lower chest: Unremarkable where included. Hepatobiliary: Unremarkable. Gallbladder appears normal. No biliary dilatation or findings of cholangitis. Pancreas:  Unremarkable Spleen: Very small spleen or splenic remnant measuring 5.3 by 1.4 by 3.0 cm (volume = 12 cm^3). Adrenals/Urinary Tract:  Unremarkable Stomach/Bowel: The stomach in duodenum appear unremarkable. Abnormal accentuated mucosal  enhancement and mild wall thickening throughout the colon, with mucosal enhancement especially notable in the distal descending colon, sigmoid colon, and rectum. Air- fluid levels in the descending colon noted compatible with diarrheal process. Wall thickening in the hepatic flexure is well shown on image 26/15. Terminal ileal involvement is not appreciated as a significant feature on today's exam. Small bowel caliber normal. Jejunal folds appear unremarkable. No appreciable skip lesions. No perianal or ischiorectal fossa abnormality identified. Vascular/Lymphatic:  Unremarkable Reproductive: Unremarkable Other:  No supplemental non-categorized findings. Musculoskeletal: Unremarkable IMPRESSION: 1. Diffuse colonic mucosal enhancement with mild to moderate degrees of wall thickening extending from the rectum to the cecum, without definite small bowel involvement an without visualized skip lesions, creeping fat, abscess, or specific perianal complicating features. These characteristics in the context of the patient's history favor ulcerative colitis. 2. Very diminutive spleen only measuring 12 cubic cm, without signs of an acute splenic abnormality. The splenic vein appears patent on today's exam. Cause for the diminutive spleen is uncertain. Electronically Signed   By: WVan ClinesM.D.   On: 05/20/2017 15:54   Mr EKerry FortPelvis W Wo Contrast  Result Date: 05/20/2017 CLINICAL DATA:  Chronic diarrhea for 6 years with suspicion for inflammatory bowel disease, [facility]. Chronic enteritis at the terminal ileum without active enteritis back in 2013. EXAM: MR ABDOMEN AND PELVIS WITHOUT AND WITH CONTRAST (MR ENTEROGRAPHY) TECHNIQUE: Multiplanar, multisequence MRI of the abdomen and pelvis was performed both before and during bolus administration of intravenous contrast. Negative oral contrast VoLumen was given. CONTRAST:  272mMULTIHANCE GADOBENATE DIMEGLUMINE 529  MG/ML IV SOLN COMPARISON:  05/20/2017 FINDINGS: Despite efforts by the technologist and patient, motion artifact is present on today's exam and could not be eliminated. This reduces exam sensitivity and specificity. COMBINED FINDINGS FOR BOTH MR ABDOMEN AND PELVIS Lower chest: Unremarkable where included. Hepatobiliary: Unremarkable. Gallbladder appears normal. No biliary dilatation or findings of cholangitis. Pancreas:  Unremarkable Spleen: Very small spleen or splenic remnant measuring 5.3 by 1.4 by 3.0 cm (volume = 12 cm^3). Adrenals/Urinary Tract:  Unremarkable Stomach/Bowel: The stomach in duodenum appear unremarkable. Abnormal accentuated mucosal enhancement and mild wall thickening throughout the colon, with mucosal enhancement especially notable in the distal descending colon, sigmoid colon, and rectum. Air- fluid levels in the descending colon noted compatible with diarrheal process. Wall thickening in the hepatic flexure is well shown on image 26/15. Terminal ileal involvement is not appreciated as a significant feature on today's exam. Small bowel caliber normal. Jejunal folds appear unremarkable. No appreciable skip lesions. No perianal or ischiorectal fossa abnormality identified. Vascular/Lymphatic:  Unremarkable Reproductive: Unremarkable Other:  No supplemental non-categorized findings. Musculoskeletal: Unremarkable IMPRESSION: 1. Diffuse colonic mucosal enhancement with mild to moderate degrees of wall thickening extending from the rectum to the cecum, without definite small bowel involvement an without visualized skip lesions, creeping fat, abscess, or specific  perianal complicating features. These characteristics in the context of the patient's history favor ulcerative colitis. 2. Very diminutive spleen only measuring 12 cubic cm, without signs of an acute splenic abnormality. The splenic vein appears patent on today's exam. Cause for the diminutive spleen is uncertain. Electronically Signed   By:  Van Clines M.D.   On: 05/20/2017 15:54    Assessment and Plan:   Damen Windsor is a 31 y.o. 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  It appears for a short time between March and April 2017 he received a few doses of Entyvio and for reasons unclear that it was stopped ?had no improvement clinically but no endosocpy to confirm absence of mucosal healing .   Based on the information is unclear whether its a  true failure of Entyvio or he had coexisting IBD-like symptoms such as bacterial overgrowth which caused significant symptoms of bloating diarrhea and abdominal discomfort.  He has responded to antibiotics on multiple occasions in the past, had several rounds of Flagyl and his symptoms have been predominant really bloating cramping and frequency of bowel movements he really has not had any rectal bleeding it is very likely possible that he does have chronic small bowel bacterial overgrowth syndrome along with ulcerative pancolitis. Responded well to xifaxan  . He in in contact with [facility] to get enrolled in a stool transplant clinical trial   Plan  1. Needs hepatitis A vaccine.  2. Annual skin exam  3. 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 He will be in touch with [facility] and based on their decision to enrol in study could consider repeat trial of Entyvio vs Xelijanz vs surgery. Explained with any option he should see a colorectal surgeon to establish a rapport in case he needs urgent surgery.    Dr Jonathon Bellows  MD,MRCP [facility]) Follow up in 4 weeks

## 2017-06-03 ENCOUNTER — Encounter: Payer: Self-pay | Admitting: Gastroenterology

## 2017-06-09 ENCOUNTER — Other Ambulatory Visit: Payer: Self-pay

## 2017-06-09 DIAGNOSIS — K580 Irritable bowel syndrome with diarrhea: Secondary | ICD-10-CM

## 2017-06-09 MED ORDER — RIFAXIMIN 550 MG PO TABS
550.0000 mg | ORAL_TABLET | Freq: Three times a day (TID) | ORAL | 0 refills | Status: DC
Start: 2017-06-09 — End: 2017-06-22

## 2017-06-09 NOTE — Progress Notes (Signed)
Contacted [facility] concerning Xifaxan Rx.  Re-sent new Rx with 30 day supply request TID 544m.  [facility]

## 2017-06-13 ENCOUNTER — Other Ambulatory Visit: Payer: Self-pay

## 2017-06-13 DIAGNOSIS — Z23 Encounter for immunization: Secondary | ICD-10-CM

## 2017-06-14 ENCOUNTER — Encounter: Payer: Self-pay | Admitting: Gastroenterology

## 2017-06-17 ENCOUNTER — Other Ambulatory Visit: Payer: Self-pay

## 2017-06-17 DIAGNOSIS — K5100 Ulcerative (chronic) pancolitis without complications: Secondary | ICD-10-CM

## 2017-06-17 MED ORDER — VEDOLIZUMAB 300 MG IV SOLR: 300.0000 mg | INTRAVENOUS | 8 refills | Status: DC

## 2017-06-21 ENCOUNTER — Telehealth: Payer: Self-pay | Admitting: Gastroenterology

## 2017-06-21 NOTE — Telephone Encounter (Signed)
*  STAT* If patient is at the pharmacy, call can be transferred to refill team.   1. Which medications need to be refilled? (please list name of each medication and dose if known) Xifaxan 550 mg  2. Which pharmacy/location (including street and city if local pharmacy) is medication to be sent to? [facility]  # [phone]  5. Do they need a 30 day or 90 day supply?

## 2017-06-21 NOTE — Telephone Encounter (Signed)
Patient LVM for you to call him regarding some medications.

## 2017-06-22 ENCOUNTER — Other Ambulatory Visit: Payer: Self-pay

## 2017-06-22 DIAGNOSIS — K580 Irritable bowel syndrome with diarrhea: Secondary | ICD-10-CM

## 2017-06-22 MED ORDER — RIFAXIMIN 550 MG PO TABS: 550.0000 mg | ORAL_TABLET | Freq: Three times a day (TID) | ORAL | 5 refills | Status: DC

## 2017-06-28 ENCOUNTER — Telehealth: Payer: Self-pay | Admitting: Gastroenterology

## 2017-06-28 NOTE — Telephone Encounter (Signed)
Please call patient regarding the infusion center

## 2017-07-05 ENCOUNTER — Other Ambulatory Visit: Payer: Self-pay

## 2017-07-05 DIAGNOSIS — K5100 Ulcerative (chronic) pancolitis without complications: Secondary | ICD-10-CM

## 2017-07-08 ENCOUNTER — Other Ambulatory Visit: Payer: Self-pay

## 2017-07-08 DIAGNOSIS — K580 Irritable bowel syndrome with diarrhea: Secondary | ICD-10-CM

## 2017-07-08 DIAGNOSIS — K5100 Ulcerative (chronic) pancolitis without complications: Secondary | ICD-10-CM

## 2017-07-08 MED ORDER — VEDOLIZUMAB 300 MG IV SOLR: 300.0000 mg | INTRAVENOUS | 8 refills | Status: DC

## 2017-07-08 NOTE — Progress Notes (Signed)
Patient has approved to have Enytvio Infusions at [facility] per Myra.   Sent Rx, Approval #, and Office visit notes.   Sent patient an email along with Myra's information for contact.

## 2017-07-12 ENCOUNTER — Ambulatory Visit: Payer: BLUE CROSS/BLUE SHIELD | Admitting: Internal Medicine

## 2017-07-12 ENCOUNTER — Ambulatory Visit: Payer: BLUE CROSS/BLUE SHIELD

## 2017-07-15 ENCOUNTER — Telehealth: Payer: Self-pay | Admitting: Gastroenterology

## 2017-07-15 NOTE — Telephone Encounter (Signed)
[facility] called and claim was rejected for [facility]io. [facility] not a [facility] plan. Need to use [facility].

## 2017-07-20 ENCOUNTER — Telehealth: Payer: Self-pay | Admitting: Gastroenterology

## 2017-07-20 NOTE — Telephone Encounter (Signed)
*  STAT* If patient is at the pharmacy, call can be transferred to refill team.   1. Which medications need to be refilled? (please list name of each medication and dose if known) Entivio   2. Which pharmacy/location (including street and city if local pharmacy) is medication to be sent to? [facility]    [phone]  3. Do they need a 30 day or 90 day supply?

## 2017-07-22 ENCOUNTER — Telehealth: Payer: Self-pay | Admitting: Gastroenterology

## 2017-07-22 NOTE — Telephone Encounter (Signed)
[facility] LVM for [employer]

## 2017-09-02 ENCOUNTER — Telehealth: Payer: Self-pay

## 2017-09-02 ENCOUNTER — Encounter: Payer: Self-pay | Admitting: Gastroenterology

## 2017-09-02 NOTE — Telephone Encounter (Signed)
Patient returned call concerning Entyvio.  Mr. Jorge Clark states that after each infusion symptoms are getting better. His last infusion was yesterday (6 week). He plans to continue the process and does not expect any drastic changes until week 14.  Sent message to Dr. Vicente Males for an update.

## 2017-09-02 NOTE — Telephone Encounter (Signed)
Received patient's lab report.   Called to inquire how patient is feeling since starting Entyvio.

## 2017-11-08 NOTE — Telephone Encounter (Signed)
Phone call to discuss potential eligibility for FMT study for UC.    32 yo man started having GI issues in 2012. Originally thought to be of infectious etiology and given repeated courses of flagyl which was helpful while he was on the antibiotic but symptoms always recurred after discontinuation of flagyl. He was ultimately diagnosed with UC in 2013. He was started on remicade which helped but he never entered full remission. In 2014 his symptoms worsened again and he was found to have developed antibodies to remicade. He was switched to humira which again helped but never got him to go into remission. He remained on humira from 2014-2017. He had occasional flares that were treated with flagyl with diminishing effect over time. He was then switched to entyvio in 2017 and never really responded so he stopped it after 8 months. He was off meds in 2018 but in 2019 restarted entyvio with rifaximin. He continues to have 7-10 BM/day. Last scope in Dec 2018 showed mild to moderate pancolitis. Patient also notes that he submitted stool sample to two companies that run microbiome analysis and he was noted to be colonized by c diff (stool tests at [facility] have been negative for infection). Based on this and b/c of his response to antibiotics, he is interested in pursuing the FMT study for UC.     I discussed with the patient that at this point FMT for IBD is investigational and the response to this mode of therapy is still unknown. Numerous case reports and case series reported promising results but findings from two randomized control trials for FMT in UC reported this summer were more subdued. One study using FMT via nasoduodenal administration showed no significant response compared to placebo ([place]. Gastroenterology 2015 Jul;149(1):110-118.e4) and a second study using FMT delivered by enema every week x 6 weeks [facility] et al Gastroenterology 2015 Jul;149(1):102-109) showed statistically significant response over  placebo but only in small subset of patients. In this study, response was more likely to occur in patients who had mild disease and had a shorter duration of disease. More recently an [place] group studied mult-donor intense FMT in Ulcerative Colitis. In this protocol, FMT was administered by enema 5 days a week for 8 weeks and they found that there was a statistically significant increase in steroid free clinical remission and clinical response rates: 44% vs. 20%, p = 0.02 and 54% vs. 23%, p < 0.01 respectively (Paramsothy et al DDW 2016).     We also discussed the potential risks associated with FMT therapy. FMT has been documented in patients with IBD who have had recurrent c diff and the majority of these cases have tolerated the therapy well but there have been some reports of flare up of IBD following FMT. Approximately 20% of patients undergoing FMT will have temporary symptoms of bloating and change in bowel habit. This usually resolves within days of the procedure. The risk for infectious complication is considered to be low because of the extensive screening of donors but it is still a consideration when transferring bodily fluids. Finally, in a follow-up of 77 patients who underwent FMT for c diff, 4 patients were found to have developed a new autoimmune disease within 1 year of FMT Carola Rhine 2013). While the potential for developing a new autoimmune disease with FMT has not been established, this study raises concern for this possibility and this patient was made aware of this concern.     Finally, I discussed standard of care options  for treatment with this patient which include consideration of tofacitinib as he has not tried this for his UC.     Our upcoming FMT trial is a study of mild to moderate Ulcerative Colitis and the primary purpose is to determine if there is a role for pretreatment antibiotics (vancomycin, metronidazole, neomycin) in FMT and to determine whether FMT by enema v. Capsule  is better for maintenance therapy and engraftment of donor microbiome. Participants will be randomized to one of four arms:   1) pretreatment antibiotics (vancomycin, metronidazole, neomycin) + FMT by colonoscopy + FMT capsules weekly x 6 weeks  2) no pretreatment antibiotics + FMT by colonoscopy + FMT capsules weekly x 6 weeks  3) pretreatment antibiotics + FMT by colonoscopy + FMT enema weekly x 6 weeks  4) no pretreatment antibiotics + FMT by colonoscopy + FMT enema weekly x 6 weeks    Patient is aware of above and agrees to proceed.

## 2017-11-14 ENCOUNTER — Encounter: Payer: Self-pay | Admitting: Gastroenterology

## 2017-11-27 ENCOUNTER — Encounter: Payer: Self-pay | Admitting: Gastroenterology

## 2017-11-28 ENCOUNTER — Other Ambulatory Visit: Payer: Self-pay | Admitting: Gastroenterology

## 2017-11-28 DIAGNOSIS — K580 Irritable bowel syndrome with diarrhea: Secondary | ICD-10-CM

## 2017-11-29 ENCOUNTER — Encounter: Payer: Self-pay | Admitting: Gastroenterology

## 2017-12-07 ENCOUNTER — Ambulatory Visit (INDEPENDENT_AMBULATORY_CARE_PROVIDER_SITE_OTHER): Payer: BLUE CROSS/BLUE SHIELD | Admitting: Gastroenterology

## 2017-12-07 ENCOUNTER — Other Ambulatory Visit
Admission: RE | Admit: 2017-12-07 | Discharge: 2017-12-07 | Disposition: A | Discharge status: Home / Self Care | Payer: BLUE CROSS/BLUE SHIELD | Source: Ambulatory Visit | Attending: Gastroenterology | Admitting: Gastroenterology

## 2017-12-07 ENCOUNTER — Encounter: Payer: Self-pay | Admitting: Gastroenterology

## 2017-12-07 VITALS — BP 97/60 | HR 62 | Wt 211.8 lb

## 2017-12-07 DIAGNOSIS — Z23 Encounter for immunization: Secondary | ICD-10-CM | POA: Diagnosis present

## 2017-12-07 DIAGNOSIS — K5100 Ulcerative (chronic) pancolitis without complications: Secondary | ICD-10-CM

## 2017-12-07 LAB — CBC WITH DIFFERENTIAL/PLATELET
Basophils Absolute: 0.1 10*3/uL (ref 0–0.1)
Basophils Relative: 1 %
EOS ABS: 0.8 10*3/uL — AB (ref 0–0.7)
EOS PCT: 10 %
HCT: 39.8 % — ABNORMAL LOW (ref 40.0–52.0)
Hemoglobin: 13.3 g/dL (ref 13.0–18.0)
LYMPHS ABS: 2.1 10*3/uL (ref 1.0–3.6)
Lymphocytes Relative: 27 %
MCH: 31.8 pg (ref 26.0–34.0)
MCHC: 33.4 g/dL (ref 32.0–36.0)
MCV: 95.3 fL (ref 80.0–100.0)
Monocytes Absolute: 0.5 10*3/uL (ref 0.2–1.0)
Monocytes Relative: 7 %
Neutro Abs: 4.4 10*3/uL (ref 1.4–6.5)
Neutrophils Relative %: 55 %
PLATELETS: 482 10*3/uL — AB (ref 150–440)
RBC: 4.18 MIL/uL — AB (ref 4.40–5.90)
RDW: 13 % (ref 11.5–14.5)
WBC: 7.9 10*3/uL (ref 3.8–10.6)

## 2017-12-07 LAB — COMPREHENSIVE METABOLIC PANEL
ALBUMIN: 3.1 g/dL — AB (ref 3.5–5.0)
ALT: 64 U/L — ABNORMAL HIGH (ref 0–44)
AST: 55 U/L — AB (ref 15–41)
Alkaline Phosphatase: 126 U/L (ref 38–126)
Anion gap: 6 (ref 5–15)
BUN: 8 mg/dL (ref 6–20)
CHLORIDE: 108 mmol/L (ref 98–111)
CO2: 26 mmol/L (ref 22–32)
CREATININE: 0.78 mg/dL (ref 0.61–1.24)
Calcium: 8.6 mg/dL — ABNORMAL LOW (ref 8.9–10.3)
GFR calc Af Amer: >60 mL/min (ref >60)
GFR calc non Af Amer: >60 mL/min (ref >60)
Glucose, Bld: 85 mg/dL (ref 70–99)
POTASSIUM: 3.7 mmol/L (ref 3.5–5.1)
SODIUM: 140 mmol/L (ref 135–145)
Total Bilirubin: 1.1 mg/dL (ref 0.3–1.2)
Total Protein: 6.4 g/dL — ABNORMAL LOW (ref 6.5–8.1)

## 2017-12-07 NOTE — Progress Notes (Signed)
Jorge Bellows MD, MRCP(U.K) [address]  [place]  [place], [place] [address]  Main: [phone]  Fax: [phone]   Primary Care Physician: Orlena Sheldon  Primary Gastroenterologist:  Dr. Jonathon Clark   Chief Complaint  Patient presents with  . Follow-up    HPI: Jorge Clark is a 32 y.o. male   Summary of history : He saw me for his first visit on 05/04/17 :   He had over 200 pages of records to be reviewed and he also has an office note from April 2018 available on epic. By himself he is well aware of his history and brought in all his records into his office visit and we discussed in detail. 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 When he returned to [facility]. Saw his doctor at [facility] and he was treated with a round of metronidazole, symptoms resolved. Once he went off metronidazole the symptoms returned, had a second course of the antibiotics, symptoms resolved . Subsequently was found to have an elevated [place], had a colonoscopy which showed mild ulcerations in the colon and was placed on Asacol (2013). From there he says it went "downhill", symptoms were worse, Then tried remicaid, after a few rounds didn't work. I do note that at one point he did have antibodies against Remicade. Was advised to see a physician at [facility], [place] the physicians there - there was a concern that he may have Crohn due to response to antibiotics. Increased dose of Remicaid, symptoms improved, he was on steroids and it got better.Since he developedntibodies was switched to Humira (2014-2015). Was restarted on Metronidazole continuously from 2015-2016 . Did well - was physically very active at that time. Spring of 2017 started having issues again .Started Entyvio in 3/2018because his liver function tests rose on Humira and  at some point he was also treated with Uceris and methotrexate-he took Entyviofor a few months - no improvement . Marland Kitchen Did not have a colonoscopy after starting Entyvio.Review of his records in April 2018 suggests that he was recommended a colonoscopy at that point of time. Stopped Entyvio 09/2016 - 3-4 rounds of treatment taken . Subsequently was suggested to have a colectomyand as per his last office note it was strongly suggested that he undergo surgery as they felt he had refractory disease. At that time he was also doing a 24 hour endurance race. He said that he was feeling well - changed his diet , says that he felt his bowel movements were getting better. Surgery was put off. Prior to first episode of C diff - had 4 tests which were negative. Saw another GI at [facility]- had a repeat C diff test - was negative but was treated with Vancoymycin- symptoms got worse off the antibiotics. Went off Vancomycin for 2 weeks , back on vacomycin with a slow taper , then felt better, was then put on Flagyl felt better- was having formed stools. Attempted to go off the antibiotics, had worsening of symptoms   Colonoscopy in May 2017 performed at [facility] suggest a diagnosis of ulcerative pancolitis and there was moderate diffuse inflammation of the entire examined colon biopsies showed moderate active chronic enterocolitis with focal erosion and reactive epithelial changes TB QuantiFERON test was negative in March 2017 hepatitis C virus antibody was negative  hepatitis B core total antibody was negative hepatitis B surface antibody was reactive hepatitis B surface antigen was negative hemoglobin is 14.3 g. Biopsies in 2013 from an upper endoscopy show normal mucosa of the duodenum and chronic gastritis of the stomach. 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 A CT scan in 2013 demonstrated mild thickening of the terminal ileum and thickening of the colonic wall and could be seen in features of Crohn's disease or ulcerative colitis with backwash ileitis. I do note that in 2017 there is has been on his medical records that he has been on methotrexate once a week subcutaneous injection as well as Uceris. Records on epic from [facility] in April 2018 when he last saw his gastroenterologist   05/23/17- Colonoscopy showed pancolitis-biopsies confirm moderate severe  05/20/17: MR enterogram showed normal small bowel but thickened colol difusely from rectum to cecum, no skip areas.  05/04/17- Stool C diff and PCR negative. TB quantiferon negative. CMP-normal  HCV ab ,Hep Bsag,HIV,CRP-negative.Immune to hep B.not immune to Hep A.Hb 14.5 grams.   Interval history 06/02/2017 -12/07/17   Labs in 08/2017 - HB 14.4, Platelet count 385  Commenced on entyvio about 17-18 weeks back.   Says presently has 7-9 bowel movements, no blood in stool. Wakes up in the night . Weight has decreased 10 lbs since last visit.   Says that he took a course of Xifaxan. Feels food is "digested" , gut feels "more settled" . Sleeps better.     Current Outpatient Medications  Medication Sig Dispense Refill  . sodium chloride 0.9 % SOLN 250 mL with vedolizumab 300 MG SOLR 300 mg Inject 300 mg into the vein See admin instructions for 8 doses. Administered over 30 mins week 0, 2, 6, and every 8 weeks following. 300 mg 8  . XIFAXAN 550 MG TABS tablet TAKE 1 TABLET (550 MG TOTAL) BY MOUTH 3 (THREE) TIMES DAILY. 90 tablet 0   No current facility-administered medications for this visit.     Allergies as of 12/07/2017  . (No Known Allergies)    ROS:  General: Negative for anorexia, weight loss, fever, chills, fatigue, weakness. ENT: Negative for hoarseness, difficulty swallowing , nasal congestion. CV: Negative for chest pain, angina, palpitations, dyspnea on exertion,  peripheral edema.  Respiratory: Negative for dyspnea at rest, dyspnea on exertion, cough, sputum, wheezing.  GI: See history of present illness. GU:  Negative for dysuria, hematuria, urinary incontinence, urinary frequency, nocturnal urination.  Endo: Negative for unusual weight change.    Physical Examination:   BP 97/60   Pulse 62   Wt 211 lb 12.8 oz (96.1 kg)   BMI 27.19 kg/m   General: Well-nourished, well-developed in no acute distress.  Eyes: No icterus. Conjunctivae pink. Mouth: Oropharyngeal mucosa moist and pink , no lesions erythema or exudate. Lungs: Clear to auscultation bilaterally. Non-labored. Heart: Regular rate and rhythm, no murmurs rubs or gallops.  Abdomen: Bowel sounds are normal, nontender, nondistended, no hepatosplenomegaly or masses, no abdominal bruits or hernia , no rebound or guarding.   Extremities: No lower extremity edema. No clubbing or deformities. Neuro: Alert and oriented x 3.  Grossly intact. Skin: Warm and dry, no jaundice.   Psych: Alert and cooperative, normal mood and affect.   Imaging Studies: No results found.  Assessment and Plan:   Diogo Anne is a 32 y.o. y/o  male here today to follow up for  ulcerative colitis. 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 It appears for a short time between March and April 2017 he received a few doses of Entyvio and for reasons unclear that it was stopped ?had no improvement clinically but no endosocpy to confirm absence of mucosal healing .Repeat colonoscopy in 05/2017 suggested active pancolitis and has been on Entyvio for about 18 weeks.    Plan  1.Check labs CRP,fecal calpropectin., C diff, PCR, CMP,CBC. Vedolizumab levels prior to next dose to determine if its a drug failure or needs a higher dose. IF a true drug failure then options  are after a sigmoidoscopy to tru Xelijanz vs clinical trials vs surgery which I have talked about on every visit.   Dr Jorge Bellows  MD,MRCP [facility]) Follow up in 4 weeks

## 2017-12-12 LAB — VARICELLA ZOSTER ANTIBODY, IGG: Varicella IgG: 386 index (ref >165)

## 2017-12-19 ENCOUNTER — Encounter (INDEPENDENT_AMBULATORY_CARE_PROVIDER_SITE_OTHER): Payer: Self-pay

## 2017-12-19 ENCOUNTER — Other Ambulatory Visit: Payer: Self-pay

## 2017-12-19 DIAGNOSIS — K5100 Ulcerative (chronic) pancolitis without complications: Secondary | ICD-10-CM

## 2017-12-20 ENCOUNTER — Encounter (INDEPENDENT_AMBULATORY_CARE_PROVIDER_SITE_OTHER): Payer: Self-pay

## 2017-12-21 LAB — COMPREHENSIVE METABOLIC PANEL
A/G RATIO: 1.4 (ref 1.2–2.2)
ALT: 46 IU/L — AB (ref 0–44)
AST: 33 IU/L (ref 0–40)
Albumin: 4.1 g/dL (ref 3.5–5.5)
Alkaline Phosphatase: 215 IU/L — ABNORMAL HIGH (ref 39–117)
BUN/Creatinine Ratio: 8 — ABNORMAL LOW (ref 9–20)
BUN: 8 mg/dL (ref 6–20)
Bilirubin Total: 0.6 mg/dL (ref 0.0–1.2)
CO2: 22 mmol/L (ref 20–29)
CREATININE: 1.04 mg/dL (ref 0.76–1.27)
Calcium: 9.5 mg/dL (ref 8.7–10.2)
Chloride: 104 mmol/L (ref 96–106)
GFR calc Af Amer: 110 mL/min/{1.73_m2} (ref >59)
GFR, EST NON AFRICAN AMERICAN: 95 mL/min/{1.73_m2} (ref >59)
Globulin, Total: 2.9 g/dL (ref 1.5–4.5)
Glucose: 77 mg/dL (ref 65–99)
POTASSIUM: 5.2 mmol/L (ref 3.5–5.2)
Sodium: 142 mmol/L (ref 134–144)
Total Protein: 7 g/dL (ref 6.0–8.5)

## 2017-12-21 LAB — CBC WITH DIFFERENTIAL/PLATELET
Basophils Absolute: 0.1 10*3/uL (ref 0.0–0.2)
Basos: 1 %
EOS (ABSOLUTE): 1 10*3/uL — AB (ref 0.0–0.4)
Eos: 13 %
Hematocrit: 41.8 % (ref 37.5–51.0)
Hemoglobin: 14.1 g/dL (ref 13.0–17.7)
Immature Grans (Abs): 0 10*3/uL (ref 0.0–0.1)
Immature Granulocytes: 0 %
Lymphocytes Absolute: 1.6 10*3/uL (ref 0.7–3.1)
Lymphs: 20 %
MCH: 31.3 pg (ref 26.6–33.0)
MCHC: 33.7 g/dL (ref 31.5–35.7)
MCV: 93 fL (ref 79–97)
MONOS ABS: 0.5 10*3/uL (ref 0.1–0.9)
Monocytes: 7 %
NEUTROS PCT: 59 %
Neutrophils Absolute: 4.8 10*3/uL (ref 1.4–7.0)
PLATELETS: 510 10*3/uL — AB (ref 150–450)
RBC: 4.51 x10E6/uL (ref 4.14–5.80)
RDW: 13.8 % (ref 12.3–15.4)
WBC: 8.1 10*3/uL (ref 3.4–10.8)

## 2017-12-21 LAB — C-REACTIVE PROTEIN: CRP: 19 mg/L — AB (ref 0–10)

## 2017-12-22 LAB — SPECIMEN STATUS REPORT

## 2017-12-23 LAB — CLOSTRIDIUM DIFFICILE BY PCR

## 2017-12-26 ENCOUNTER — Other Ambulatory Visit: Payer: Self-pay

## 2017-12-26 DIAGNOSIS — K580 Irritable bowel syndrome with diarrhea: Secondary | ICD-10-CM

## 2017-12-27 ENCOUNTER — Encounter: Admit: 2017-12-27 | Discharge: 2017-12-28 | Payer: PRIVATE HEALTH INSURANCE

## 2017-12-27 ENCOUNTER — Encounter: Payer: Self-pay | Admitting: Gastroenterology

## 2017-12-27 ENCOUNTER — Other Ambulatory Visit: Payer: Self-pay

## 2017-12-27 DIAGNOSIS — K5100 Ulcerative (chronic) pancolitis without complications: Principal | ICD-10-CM

## 2017-12-27 LAB — THIOPURINE METHYLTRANSFERASE (TPMT), RBC: TPMT ACTIVITY: 21.7 U/mL{RBCs}

## 2017-12-27 LAB — CALPROTECTIN, FECAL: CALPROTECTIN, FECAL: 389 ug/g — AB (ref 0–120)

## 2017-12-29 ENCOUNTER — Other Ambulatory Visit: Payer: Self-pay | Admitting: Gastroenterology

## 2017-12-31 LAB — CLOSTRIDIUM DIFFICILE BY PCR: Toxigenic C. Difficile by PCR: NEGATIVE

## 2018-01-04 ENCOUNTER — Encounter: Payer: Self-pay | Admitting: Gastroenterology

## 2018-01-06 ENCOUNTER — Encounter: Payer: Self-pay | Admitting: Gastroenterology

## 2018-01-07 LAB — VEDOLIZUMAB AND ANTI-VEDO AB: VEDOLIZUMAB: 27 ug/mL

## 2018-01-09 ENCOUNTER — Encounter: Payer: Self-pay | Admitting: Gastroenterology

## 2018-01-17 ENCOUNTER — Other Ambulatory Visit: Payer: Self-pay

## 2018-01-17 DIAGNOSIS — K5100 Ulcerative (chronic) pancolitis without complications: Secondary | ICD-10-CM

## 2018-01-17 MED ORDER — VEDOLIZUMAB 300 MG IV SOLR: 300.0000 mg | INTRAVENOUS | 8 refills | Status: AC

## 2018-01-18 ENCOUNTER — Encounter: Payer: Self-pay | Admitting: Gastroenterology

## 2018-01-18 ENCOUNTER — Other Ambulatory Visit
Admission: RE | Admit: 2018-01-18 | Discharge: 2018-01-18 | Disposition: A | Discharge status: Home / Self Care | Payer: BLUE CROSS/BLUE SHIELD | Source: Ambulatory Visit | Attending: Gastroenterology | Admitting: Gastroenterology

## 2018-01-18 ENCOUNTER — Ambulatory Visit (INDEPENDENT_AMBULATORY_CARE_PROVIDER_SITE_OTHER): Payer: BLUE CROSS/BLUE SHIELD | Admitting: Gastroenterology

## 2018-01-18 ENCOUNTER — Telehealth: Payer: Self-pay

## 2018-01-18 VITALS — BP 106/53 | HR 67 | Ht 74.0 in | Wt 210.4 lb

## 2018-01-18 DIAGNOSIS — K5100 Ulcerative (chronic) pancolitis without complications: Secondary | ICD-10-CM

## 2018-01-18 LAB — COMPREHENSIVE METABOLIC PANEL
ALK PHOS: 111 U/L (ref 38–126)
ALT: 24 U/L (ref 0–44)
AST: 27 U/L (ref 15–41)
Albumin: 3.5 g/dL (ref 3.5–5.0)
Anion gap: 9 (ref 5–15)
BUN: 9 mg/dL (ref 6–20)
CHLORIDE: 105 mmol/L (ref 98–111)
CO2: 26 mmol/L (ref 22–32)
CREATININE: 0.89 mg/dL (ref 0.61–1.24)
Calcium: 8.8 mg/dL — ABNORMAL LOW (ref 8.9–10.3)
GFR calc Af Amer: >60 mL/min (ref >60)
GFR calc non Af Amer: >60 mL/min (ref >60)
GLUCOSE: 89 mg/dL (ref 70–99)
Potassium: 4.1 mmol/L (ref 3.5–5.1)
SODIUM: 140 mmol/L (ref 135–145)
Total Bilirubin: 0.9 mg/dL (ref 0.3–1.2)
Total Protein: 6.9 g/dL (ref 6.5–8.1)

## 2018-01-18 LAB — C-REACTIVE PROTEIN

## 2018-01-18 MED ORDER — BUDESONIDE 3 MG PO CPEP: 9.0000 mg | ORAL_CAPSULE | Freq: Every day | ORAL | 0 refills | Status: AC

## 2018-01-18 NOTE — Telephone Encounter (Signed)
Prescription for Jorge Clark has been faxed to [facility] at [facility].  Thanks [employer]

## 2018-01-18 NOTE — Progress Notes (Signed)
Jonathon Bellows MD, MRCP(U.K) [address]  [place]  [place], [place] [address]  Main: [phone]  Fax: [phone]   Primary Care Physician: Orlena Sheldon  Primary Gastroenterologist:  Dr. Jonathon Bellows   Chief Complaint  Patient presents with  . Follow-up    4 wk ulcerative colitis    HPI: Jorge Clark is a 32 y.o. male   Summary of history : He saw me for his first visit on 05/04/17 :  He had over 200 pages of records to be reviewed and he also has an office note from April 2018 available on epic. By himself he is well aware of his history and brought in all his records into his office visit and we discussed in detail. 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 When he returned to [facility]. Saw his doctor at [facility] and he was treated with a round of metronidazole, symptoms resolved. Once he went off metronidazole the symptoms returned, had a second course of the antibiotics, symptoms resolved . Subsequently was found to have an elevated [place], had a colonoscopy which showed mild ulcerations in the colon and was placed on Asacol (2013). From there he says it went "downhill", symptoms were worse, Then tried remicaid, after a few rounds didn't work. I do note that at one point he did have antibodies against Remicade. Was advised to see a physician at [facility], [place] the physicians there - there was a concern that he may have Crohn due to response to antibiotics. Increased dose of Remicaid, symptoms improved, he was on steroids and it got better.Since he developedntibodies was switched to Humira (2014-2015). Was restarted on Metronidazole continuously from 2015-2016 . Did well - was physically very active at that time. Spring of 2017 started having issues again .Started Entyvio in 3/2018because his liver  function tests rose on Humira and at some point he was also treated with Uceris and methotrexate-he took Entyviofor a few months - no improvement . Did not have a colonoscopy after starting Entyvio.Review of his records in April 2018 suggests that he was recommended a colonoscopy at that point of time. Stopped Entyvio 09/2016 - 3-4 rounds of treatment taken . Subsequently was suggested to have a colectomyand as per his last office note it was strongly suggested that he undergo surgery as they felt he had refractory disease. At that time he was also doing a 24 hour endurance race. He said that he was feeling well - changed his diet , says that he felt his bowel movements were getting better. Surgery was put off. Prior to first episode of C diff - had 4 tests which were negative. Saw another GI at [facility]- had a repeat C diff test - was negative but was treated with Vancoymycin- symptoms got worse off the antibiotics. Went off Vancomycin for 2 weeks , back on vacomycin with a slow taper , then felt better, was then put on Flagyl felt better- was having formed stools. Attempted to go off the antibiotics, had worsening of symptoms   Colonoscopy in May 2017 performed at [facility] suggest a diagnosis of ulcerative pancolitis and there was moderate diffuse inflammation of the entire examined colon biopsies showed moderate active chronic enterocolitis with focal erosion and reactive epithelial changes TB QuantiFERON test was negative in March 2017 hepatitis  C virus antibody was negative hepatitis B core total antibody was negative hepatitis B surface antibody was reactive hepatitis B surface antigen was negative hemoglobin is 14.3 g. Biopsies in 2013 from an upper endoscopy show normal mucosa of the duodenum and chronic gastritis of the stomach. 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 A CT scan in 2013 demonstrated mild thickening of the terminal ileum and thickening of the colonic wall and could be seen in features of Crohn's disease or ulcerative colitis with backwash ileitis. I do note that in 2017 there is has been on his medical records that he has been on methotrexate once a week subcutaneous injection as well as Uceris. Records on epic from [facility] in April 2018 when he last saw his gastroenterologist  05/23/17- Colonoscopy showed pancolitis-biopsies confirm moderate severe  05/20/17: MR enterogram showed normal small bowel but thickened colol difusely from rectum to cecum, no skip areas.   HCV ab ,Hep Bsag,HIV,CRP-negative.Immune to hep B.not immune to Hep A.Hb 14.5 grams.  Labs in 08/2017 - HB 14.4, Platelet count 385  Commenced on entyvio about 17-18 weeks back.     Interval history7/3/19-8/14/2019 12/29/2017: C. difficile PCR negative, Vedolizuman levels are 27 mcg/mL just prior to his infusion when he was due, levels for antibody less than 25 ng/mL, fecal calprotectin 389,  Says presently has 10 bowel movements, no blood in stool.   CRP 19, BMP normal but alkaline phosphatase elevated at 215 ALT 46, hemoglobin 14.1 g platelet count of 510.  No other symptoms . Long discussion regarding option to increase entyvio dose by decreasing intervals between doses, if fails Xelijanz, in meanwhile establish with surgeon for potential surgery . No NSAID use.    Current Outpatient Medications  Medication Sig Dispense Refill  . sodium chloride 0.9 % SOLN 250 mL with vedolizumab 300 MG SOLR 300 mg Inject 300 mg into the vein See admin instructions for 8 doses. Inject 300 mg IV every 4 weeks. 300 mg 8  . XIFAXAN 550 MG TABS tablet TAKE 1 TABLET (550 MG TOTAL) BY MOUTH 3 (THREE) TIMES DAILY. 90 tablet 0   No current facility-administered medications for this visit.     Allergies as of 01/18/2018  . (No Known Allergies)     ROS:  General: Negative for anorexia, weight loss, fever, chills, fatigue, weakness. ENT: Negative for hoarseness, difficulty swallowing , nasal congestion. CV: Negative for chest pain, angina, palpitations, dyspnea on exertion, peripheral edema.  Respiratory: Negative for dyspnea at rest, dyspnea on exertion, cough, sputum, wheezing.  GI: See history of present illness. GU:  Negative for dysuria, hematuria, urinary incontinence, urinary frequency, nocturnal urination.  Endo: Negative for unusual weight change.    Physical Examination:   BP (!) 106/53   Pulse 67   Ht 6' 2"  (1.88 m)   Wt 210 lb 6.4 oz (95.4 kg)   BMI 27.01 kg/m   General: Well-nourished, well-developed in no acute distress.  Eyes: No icterus. Conjunctivae pink. Mouth: Oropharyngeal mucosa moist and pink , no lesions erythema or exudate. Lungs: Clear to auscultation bilaterally. Non-labored. Heart: Regular rate and rhythm, no murmurs rubs or gallops.  Abdomen: Bowel sounds are normal, nontender, nondistended, no hepatosplenomegaly or masses, no abdominal bruits or hernia , no rebound or guarding.   Extremities: No lower extremity edema. No clubbing or deformities. Neuro: Alert and oriented x 3.  Grossly intact. Skin: Warm and dry, no jaundice.   Psych: Alert and cooperative, normal mood and affect.   Imaging Studies: No results found.  Assessment and Plan:   Jorge Clark is a 32 y.o. y/o malehere today to follow up for  ulcerative colitis. 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 It appears for a short time between March and April 2017 he received a few doses of Entyvio and for reasons unclear that it was stopped?had no improvement clinically but no endosocpy to confirm absence of mucosal healing.Repeat colonoscopy in 05/2017 suggested active  pancolitis and had  been on Entyvio for about 18 weeks, endoscopically had active disease. Levels were subtherapeutic and due for q 4 weekly doses starting next week.   We have spoke extensively in the past about alternative therapies such as fecal transplant and AMT, I have spoken to the physician who performs AMT and as per my understanding with the discussion I had with him on the phone about 2 weeks back ,AMT therapy in patients with Crohn's disease although has not been endorsed by the [employer] of gastroenterology. I strongly believe he has ulcerative colitis and the episodes of response he has had to treatment with antibiotics in the past probably related to small bowel bacterial overgrowth.  I did inform him that since it is not a mainstream therapy I would not be able to assist with the  same including that of fecal transplant.   Plan  1. Check vitamin D, CRP,CMP  2. Await  response to Entyvio q 4 weekly . If no better clinically and endoscopically then will try Xelijanz.  3. Advised to let me know if I can refer him to a surgeon at [facility], he said he will decide on the surgeon he would like to see   Dr Jonathon Bellows  MD,MRCP [facility]) Follow up in 8 weeks

## 2018-01-22 LAB — VITAMIN D 1,25 DIHYDROXY
Vitamin D 1, 25 (OH)2 Total: 61 pg/mL
Vitamin D3 1, 25 (OH)2: 61 pg/mL

## 2018-02-15 ENCOUNTER — Encounter
Admit: 2018-02-15 | Discharge: 2018-02-16 | Payer: PRIVATE HEALTH INSURANCE | Attending: Gastroenterology | Primary: Gastroenterology

## 2018-02-15 DIAGNOSIS — K5100 Ulcerative (chronic) pancolitis without complications: Principal | ICD-10-CM

## 2018-02-15 DIAGNOSIS — Z79899 Other long term (current) drug therapy: Secondary | ICD-10-CM

## 2018-02-15 MED ORDER — METRONIDAZOLE 500 MG TABLET
ORAL_TABLET | Freq: Two times a day (BID) | ORAL | 0 refills | 0 days | Status: CP
Start: 2018-02-15 — End: 2018-03-13

## 2018-02-15 MED ORDER — DOXYCYCLINE HYCLATE 100 MG TABLET
ORAL_TABLET | Freq: Two times a day (BID) | ORAL | 0 refills | 0 days | Status: CP
Start: 2018-02-15 — End: 2018-03-13

## 2018-02-15 MED ORDER — AMOXICILLIN 875 MG-POTASSIUM CLAVULANATE 125 MG TABLET
ORAL_TABLET | Freq: Two times a day (BID) | ORAL | 0 refills | 0.00000 days | Status: CP
Start: 2018-02-15 — End: 2018-03-13

## 2018-03-13 MED ORDER — METRONIDAZOLE 500 MG TABLET
ORAL_TABLET | Freq: Two times a day (BID) | ORAL | 0 refills | 0.00000 days | Status: CP
Start: 2018-03-13 — End: 2018-03-17

## 2018-03-13 MED ORDER — AMOXICILLIN 875 MG-POTASSIUM CLAVULANATE 125 MG TABLET
ORAL_TABLET | Freq: Two times a day (BID) | ORAL | 0 refills | 0 days | Status: CP
Start: 2018-03-13 — End: 2018-04-06

## 2018-03-13 MED ORDER — DOXYCYCLINE HYCLATE 100 MG TABLET
ORAL_TABLET | Freq: Two times a day (BID) | ORAL | 0 refills | 0 days | Status: CP
Start: 2018-03-13 — End: 2018-04-06

## 2018-03-15 ENCOUNTER — Encounter: Payer: Self-pay | Admitting: Gastroenterology

## 2018-03-15 ENCOUNTER — Ambulatory Visit (INDEPENDENT_AMBULATORY_CARE_PROVIDER_SITE_OTHER): Payer: BLUE CROSS/BLUE SHIELD | Admitting: Gastroenterology

## 2018-03-15 VITALS — BP 103/60 | HR 43 | Ht 74.0 in | Wt 217.4 lb

## 2018-03-15 DIAGNOSIS — K5100 Ulcerative (chronic) pancolitis without complications: Secondary | ICD-10-CM

## 2018-03-15 NOTE — Progress Notes (Signed)
Jonathon Bellows MD, MRCP(U.K) [address]  [place]  [place], [place] [address]  Main: [phone]  Fax: [phone]   Primary Care Physician: Orlena Sheldon  Primary Gastroenterologist:  Dr. Jonathon Bellows   Chief Complaint  Patient presents with  . Follow-up    Ulcerative colitis    HPI: Jorge Clark is a 32 y.o. male   Summary of history : He saw me for his first visit on 05/04/17 :  He had over 200 pages of records to be reviewed and he also has an office note from April 2018 available on epic. By himself he is well aware of his history and brought in all his records into his office visit and we discussed in detail. 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 When he returned to [facility]. Saw his doctor at [facility] and he was treated with a round of metronidazole, symptoms resolved. Once he went off metronidazole the symptoms returned, had a second course of the antibiotics, symptoms resolved . Subsequently was found to have an elevated [employer], had a colonoscopy which showed mild ulcerations in the colon and was placed on Asacol (2013). From there he says it went "downhill", symptoms were worse, Then tried remicaid, after a few rounds didn't work. I do note that at one point he did have antibodies against Remicade. Was advised to see a physician at [facility] [place] the physicians there - there was a concern that he may have Crohn due to response to antibiotics. Increased dose of Remicaid, symptoms improved, he was on steroids and it got better.Since he developedntibodies was switched to Humira (2014-2015). Was restarted on Metronidazole continuously from 2015-2016 . Did well - was physically very active at that time. Spring of 2017 started having issues again .Started Entyvio in 3/2018because his liver function  tests rose on Humira and at some point he was also treated with Uceris and methotrexate-he took Entyviofor a few months - no improvement . Did not have a colonoscopy after starting Entyvio.Review of his records in April 2018 suggests that he was recommended a colonoscopy at that point of time. Stopped Entyvio 09/2016 - 3-4 rounds of treatment taken . Subsequently was suggested to have a colectomyand as per his last office note it was strongly suggested that he undergo surgery as they felt he had refractory disease. At that time he was also doing a 24 hour endurance race. He said that he was feeling well - changed his diet , says that he felt his bowel movements were getting better. Surgery was put off. Prior to first episode of C diff - had 4 tests which were negative. Saw another GI at [facility]- had a repeat C diff test - was negative but was treated with Vancoymycin- symptoms got worse off the antibiotics. Went off Vancomycin for 2 weeks , back on vacomycin with a slow taper , then felt better, was then put on Flagyl felt better- was having formed stools. Attempted to go off the antibiotics, had worsening of symptoms   Colonoscopy in May 2017 performed at [facility] suggest a diagnosis of ulcerative pancolitis and there was moderate diffuse inflammation of the entire examined colon biopsies showed moderate active chronic enterocolitis with focal erosion and reactive epithelial changes TB QuantiFERON test was negative in March 2017 hepatitis C virus  antibody was negative hepatitis B core total antibody was negative hepatitis B surface antibody was reactive hepatitis B surface antigen was negative hemoglobin is 14.3 g. Biopsies in 2013 from an upper endoscopy show normal mucosa of the duodenum and chronic gastritis of the stomach. 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 A CT scan in 2013 demonstrated mild thickening of the terminal ileum and thickening of the colonic wall and could be seen in features of Crohn's disease or ulcerative colitis with backwash ileitis. I do note that in 2017 there is has been on his medical records that he has been on methotrexate once a week subcutaneous injection as well as Uceris. Records on epic from [facility] in April 2018 when he last saw his gastroenterologist  05/23/17- Colonoscopy showed pancolitis-biopsies confirm moderate severe  05/20/17: MR enterogram showed normal small bowel but thickened colol difusely from rectum to cecum, no skip areas.   HCV ab ,Hep Bsag,HIV,CRP-negative.Immune to hep B.not immune to Hep A.Hb 14.5 grams.  Labs in 08/2017 - HB 14.4, Platelet count 385 12/29/2017: C. difficile PCR negative, Vedolizuman levels are 27 mcg/mL just prior to his infusion when he was due, levels for antibody less than 25 ng/mL, fecal calprotectin 389,  Commenced on entyvio about 17-18 weeks back   Interval history8/14/2019-03/15/18   Seen by Dr Alisia Ferrari at [place] on 02/15/18   Options of a regime with antibiotics if fails either Xelijanz or surgery discused. Tried on entocort which was stopped due to non response.   Seen by Dr Domenica Fail colorectal surgeon who recommended .  Says the aim of triple antibiotic therapy is to induce remission and then maintain remission with Entyvio. Been on Q weekly entyvio - 16 weeks .   Has 30 more days of antibiotics. Presently has 3-4 bowel movements a day , stools are more formed, no blood. There are times he has completely normal formed stools.   Has been suggested that he needs a colonoscopy end of November/december .    Labs 01/18/18 :L CMP shows normal LFT's , CRP,vitamin D -  normal      Current Outpatient Medications  Medication Sig Dispense Refill  . amoxicillin-clavulanate (AUGMENTIN) 875-125 MG tablet Take by mouth.    . doxycycline  (VIBRA-TABS) 100 MG tablet   0  . metroNIDAZOLE (FLAGYL) 500 MG tablet Take by mouth.    . sodium chloride 0.9 % SOLN 250 mL with vedolizumab 300 MG SOLR 300 mg Inject 300 mg into the vein See admin instructions for 8 doses. Inject 300 mg IV every 4 weeks. 300 mg 8  . budesonide (ENTOCORT EC) 3 MG 24 hr capsule Take 3 capsules (9 mg total) by mouth daily. (Patient not taking: Reported on 03/15/2018) 90 capsule 0  . XIFAXAN 550 MG TABS tablet TAKE 1 TABLET (550 MG TOTAL) BY MOUTH 3 (THREE) TIMES DAILY. (Patient not taking: Reported on 03/15/2018) 90 tablet 0   No current facility-administered medications for this visit.     Allergies as of 03/15/2018  . (No Known Allergies)    ROS:  General: Negative for anorexia, weight loss, fever, chills, fatigue, weakness. ENT: Negative for hoarseness, difficulty swallowing , nasal congestion. CV: Negative for chest pain, angina, palpitations, dyspnea on exertion, peripheral edema.  Respiratory: Negative for dyspnea at rest, dyspnea on exertion, cough, sputum, wheezing.  GI: See history of present illness.  GU:  Negative for dysuria, hematuria, urinary incontinence, urinary frequency, nocturnal urination.  Endo: Negative for unusual weight change.    Physical Examination:   BP 103/60   Pulse (!) 43   Ht 6' 2"  (1.88 m)   Wt 217 lb 6.4 oz (98.6 kg)   BMI 27.91 kg/m   General: Well-nourished, well-developed in no acute distress.  Eyes: No icterus. Conjunctivae pink. Mouth: Oropharyngeal mucosa moist and pink , no lesions erythema or exudate. Lungs: Clear to auscultation bilaterally. Non-labored. Heart: Regular rate and rhythm, no murmurs rubs or gallops.  Abdomen: Bowel sounds are normal, nontender, nondistended, no hepatosplenomegaly or masses, no abdominal bruits or hernia , no rebound or guarding.   Extremities: No lower extremity edema. No clubbing or deformities. Neuro: Alert and oriented x 3.  Grossly intact. Skin: Warm and dry, no  jaundice.   Psych: Alert and cooperative, normal mood and affect.   Imaging Studies: No results found.  Assessment and Plan:   Jorge Clark is a 32 y.o. y/o male here today to follow up forulcerative colitis. 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 It appears for a short time between March and April 2017 he received a few doses of Entyvio and for reasons unclear that it was stopped?had no improvement clinically but no endosocpy to confirm absence of mucosal healing.Repeat colonoscopy in 05/2017 suggested active pancolitis and had  been on Entyvio for about 18 weeks, endoscopically had active disease. Levels were subtherapeutic and  Presently in entyvio Q 4 weekly for the past few months. Clinically seems to have improved .    Since his last visit he has been seen by [facility] surgeon and GI - he is trying an antibiotic regime which has some mention in the GI literature to see if it can induce remission for his colitis. I did explain that the fact he is steroid non responsive, failed multiple lines of therapy that am less inclined to think that these options will achieve sustained remission. Nevertheless I have explained that he decide on a time frame to see when he would like to consider switching to Xelijanz vs proceed with surgery if he does not improve . He has already have had these options discussed. Explained that with Leonarda Salon he would need zoster vaccine prior.      Dr Jonathon Bellows  MD,MRCP [facility]) Follow up in prn

## 2018-03-17 MED ORDER — VANCOMYCIN 125 MG CAPSULE
ORAL_CAPSULE | Freq: Four times a day (QID) | ORAL | 0 refills | 0 days | Status: CP
Start: 2018-03-17 — End: 2018-04-06

## 2018-04-06 ENCOUNTER — Encounter
Admit: 2018-04-06 | Discharge: 2018-04-07 | Payer: PRIVATE HEALTH INSURANCE | Attending: Gastroenterology | Primary: Gastroenterology

## 2018-04-06 DIAGNOSIS — K5100 Ulcerative (chronic) pancolitis without complications: Principal | ICD-10-CM

## 2018-04-06 DIAGNOSIS — Z79899 Other long term (current) drug therapy: Secondary | ICD-10-CM

## 2018-04-06 MED ORDER — VANCOMYCIN 125 MG CAPSULE
ORAL_CAPSULE | Freq: Four times a day (QID) | ORAL | 0 refills | 0 days | Status: CP
Start: 2018-04-06 — End: 2018-04-20

## 2018-04-06 MED ORDER — DOXYCYCLINE HYCLATE 100 MG TABLET
ORAL_TABLET | Freq: Two times a day (BID) | ORAL | 0 refills | 0 days | Status: CP
Start: 2018-04-06 — End: 2018-04-18

## 2018-04-06 MED ORDER — SOD PICOSULF 10 MG-MAGNES 3.5 GRAM-CITRIC 12 GRAM/160 ML ORAL SOLUTION
0 refills | 0 days | Status: CP
Start: 2018-04-06 — End: 2018-08-14

## 2018-04-18 MED ORDER — DOXYCYCLINE HYCLATE 100 MG TABLET
ORAL_TABLET | Freq: Two times a day (BID) | ORAL | 1 refills | 0 days | Status: CP
Start: 2018-04-18 — End: 2018-05-27

## 2018-04-25 MED ORDER — LACTOBACILLUS #2-BIFIDOBACTER #1-S. THERM 900 BILLION CELL PACKET
PACK | Freq: Every day | ORAL | 6 refills | 0 days | Status: CP
Start: 2018-04-25 — End: 2018-05-25

## 2018-04-26 MED ORDER — PEG-ELECTROLYTE SOLUTION 420 GRAM ORAL SOLUTION
Freq: Once | ORAL | 0 refills | 0.00000 days | Status: CP
Start: 2018-04-26 — End: 2018-04-26

## 2018-05-02 MED ORDER — LACTOBACILLUS #2-BIFIDOBACTER #1-S. THERM 900 BILLION CELL PACKET
PACK | Freq: Two times a day (BID) | ORAL | 2 refills | 0 days | Status: CP
Start: 2018-05-02 — End: 2018-07-18

## 2018-05-18 ENCOUNTER — Encounter: Payer: Self-pay | Admitting: Gastroenterology

## 2018-05-26 ENCOUNTER — Encounter
Admit: 2018-05-26 | Discharge: 2018-05-26 | Payer: PRIVATE HEALTH INSURANCE | Attending: Anesthesiology | Primary: Anesthesiology

## 2018-05-26 ENCOUNTER — Ambulatory Visit: Admit: 2018-05-26 | Discharge: 2018-05-26 | Payer: PRIVATE HEALTH INSURANCE

## 2018-05-26 DIAGNOSIS — K5100 Ulcerative (chronic) pancolitis without complications: Principal | ICD-10-CM

## 2018-05-27 MED ORDER — METRONIDAZOLE 500 MG TABLET
ORAL_TABLET | Freq: Two times a day (BID) | ORAL | 0 refills | 0 days | Status: CP
Start: 2018-05-27 — End: 2018-08-14

## 2018-05-27 MED ORDER — DOXYCYCLINE HYCLATE 100 MG TABLET
ORAL_TABLET | Freq: Two times a day (BID) | ORAL | 1 refills | 0.00000 days | Status: CP
Start: 2018-05-27 — End: 2018-08-14

## 2018-05-27 MED ORDER — VANCOMYCIN 125 MG CAPSULE
ORAL_CAPSULE | Freq: Two times a day (BID) | ORAL | 0 refills | 0.00000 days | Status: CP
Start: 2018-05-27 — End: 2018-08-14

## 2018-06-12 ENCOUNTER — Telehealth: Payer: Self-pay | Admitting: Gastroenterology

## 2018-06-12 NOTE — Telephone Encounter (Signed)
Ranai from [facility] is needing recent notes to approve infusion please fax to [phone]

## 2018-06-14 NOTE — Telephone Encounter (Signed)
Pt notes have been faxed.

## 2018-06-15 ENCOUNTER — Telehealth: Payer: Self-pay | Admitting: Gastroenterology

## 2018-06-15 NOTE — Telephone Encounter (Signed)
Jorge Clark from [facility] is calling regarding pt  Having a new Provider at [facility] they are  Wanting to see if the pt was due for his Jorge Clark yesterday  They need order faxed to [phone]  cb [phone]

## 2018-06-16 ENCOUNTER — Encounter: Admit: 2018-06-16 | Discharge: 2018-06-17 | Payer: PRIVATE HEALTH INSURANCE

## 2018-06-16 DIAGNOSIS — K529 Noninfective gastroenteritis and colitis, unspecified: Secondary | ICD-10-CM

## 2018-06-16 DIAGNOSIS — K5100 Ulcerative (chronic) pancolitis without complications: Principal | ICD-10-CM

## 2018-06-16 NOTE — Telephone Encounter (Signed)
Spoke with pt regarding his entyvio infusions. Pt states he is now receiving his infusions at [facility]. Pt new [facility] provider will handle pt medication orders.

## 2018-07-18 MED ORDER — VISBIOME 900 BILLION CELL ORAL POWDER PACKET
PACK | Freq: Two times a day (BID) | ORAL | 2 refills | 0.00000 days | Status: CP
Start: 2018-07-18 — End: 2018-12-07

## 2018-07-19 ENCOUNTER — Encounter: Admit: 2018-07-19 | Discharge: 2018-07-20 | Payer: PRIVATE HEALTH INSURANCE

## 2018-07-19 DIAGNOSIS — K5100 Ulcerative (chronic) pancolitis without complications: Principal | ICD-10-CM

## 2018-07-19 DIAGNOSIS — K529 Noninfective gastroenteritis and colitis, unspecified: Secondary | ICD-10-CM

## 2018-08-02 NOTE — Unmapped (Signed)
FOLLOWUP NOTE - INFLAMMATORY BOWEL DISEASE  08/03/2018    Demographics:  Philip Barnett is a 33 y.o. 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  Currently 2-3 loose bowel movements daily, no blood.  No abdominal pain.  No fever.  No extraintestinal manifestations of IBD.       Review of Systems:   Review of systems positive for: negative except as above.   Otherwise, the balance of 10 systems is negative.          IBD HISTORY:     Year of disease onset:  2012    Brief IBD Disease Course:    - 2012 - onset of gut problems with stool frequency and diarrhea.  Saw PCP - ?Giardia or infection but didn't improve.   - 2013 - saw GI, dx with colitis in spring 2013.  Asacol and other 5-ASA's didn't work.  Established care at [facility].  Treated with prednisone and then started on Infliximab in February 2013.  Was on with this. Had partial response initially, later on dose increase but then loss of response with low titer antibodies. Changed to Humira in June 2014 with gradual improvement, esp after increasing to weekly dosing.  Changed to MTX due to elevated LFTs with .   - 2016 - worsening symptoms in fall 2016.  Uceris was added, still not feeling great.  Started on Entyvio 08/05/15 (but continued to take Humira through April). Fall of 2017 stop of Entyvio  - 2018 - followed with various providers, tried various regimens of antibiotics, interested in FMT or MAP therapy. Some success on vancomycin and metronidazole, but metronidazole had to be stopped due to peripheral neuropathy (tingeling in feet in the night).   07/2017 restart Entyvio, around 7 /2019 increase to every 4 weeks.        Endoscopy:      Colonoscopy 05/2018 :  Normal terminal ileum.                       - Mild pan-ulcerative colitis (Mayo 1). Improved from                        prior. Biopsied.    A: Colon, right, biopsy  - Severe chronic active colitis with ulceration and inflamed granulation tissue, no dysplasia identified  - No CMV viral cytopathic effect or granulomas identified  ??  B: Colon, left, biopsy  - Moderate to severe chronic active colitis, negative for dysplasia  - No CMV viral cytopathic effect or granulomas identified    Colonoscopy 05/2017 moderate inflammation throughout colon Pan UC. Terminal ileum normal.    Colonoscopy 10/09/15 - diffuse moderate (?Mayo 2) pancolitis, normal ileum     Imaging:    - CT A/P 12/15/11 - inflammatory changes of colon with wall thickening and loss of haustra. TI with mild thickening distally (?backwash).     Prior IBD  medications (type, dose, duration, response):  x 5-ASAs - Asacol and others  x Oral corticosteroids  - yes, limited effect  ? Intravenous corticosteroids  XAntibiotics  x Thiopurines - - dose limited by elevated LFTs  x Methotrexate - intense side effects, gastric and other  x Anti-TNF therapies - IFX 2013 - 09/2012.  Partial response, low level Ab.  Humira started 09/2012 - stopped 09/2015.   X Anti-Integrin therapies - Vedolizumab start 08/05/15 limited response after 12 weeks.   ? Cyclosporine  ? Clinical trial medication  ? Other (Please specify):          Past Medical History:   Past medical history:   Past Medical History:   Diagnosis Date   ??? Crohn's colitis (CMS-HCC)      Past surgical history:   Past Surgical History:   Procedure Laterality Date   ??? ARTHROSCOPIC REPAIR ACL Left 2008   ??? FINGER SURGERY Left 2017   ??? PR COLONOSCOPY W/BIOPSY SINGLE/MULTIPLE Left 12/23/2014    Procedure: COLONOSCOPY, FLEXIBLE, PROXIMAL TO SPLENIC FLEXURE; WITH BIOPSY, SINGLE OR MULTIPLE;  Surgeon: Zetta Bills, MD;  Location: [facility];  Service: Gastroenterology   ??? PR COLONOSCOPY W/BIOPSY SINGLE/MULTIPLE Left 10/09/2015    Procedure: COLONOSCOPY, FLEXIBLE, PROXIMAL TO SPLENIC FLEXURE; WITH BIOPSY, SINGLE OR MULTIPLE;  Surgeon: Luanne Bras, MD;  Location: [facility];  Service: Gastroenterology   ??? PR COLONOSCOPY W/BIOPSY SINGLE/MULTIPLE Left 05/26/2018    Procedure: COLONOSCOPY, FLEXIBLE, PROXIMAL TO SPLENIC FLEXURE; WITH BIOPSY, SINGLE OR MULTIPLE;  Surgeon: Modena Nunnery, MD;  Location: [facility];  Service: Gastroenterology   ??? TONSILLECTOMY       Family history:   Family History   Problem Relation Age of Onset   ??? Ulcerative colitis Father    ??? Crohn's disease Neg Hx    ??? Colorectal Cancer Neg Hx    ??? Celiac disease Neg Hx    ??? Wilson's disease Neg Hx    ??? Liver cancer Neg Hx    ??? Cirrhosis Neg Hx      Social history:   Social History     Socioeconomic History   ??? Marital status: Single     Spouse name: None   ??? Number of children: None   ??? Years of education: None   ??? Highest education level: None   Occupational History   ??? None   Social Needs   ??? Financial resource strain: None   ??? Food insecurity     Worry: None     Inability: None   ??? Transportation needs     Medical: None     Non-medical: None   Tobacco Use   ??? Smoking status: Never Smoker   ??? Smokeless tobacco: Never Used   Substance and Sexual Activity   ??? Alcohol use: Yes     Alcohol/week: 0.0 standard drinks     Comment: very rarely   ??? Drug use: No   ??? Sexual activity: None   Lifestyle   ??? Physical activity     Days per week: None     Minutes per session: None   ??? Stress: None   Relationships   ??? Social Wellsite geologist on phone: None     Gets together: None     Attends religious service: None     Active member of club or organization: None     Attends meetings of clubs or organizations: None  Relationship status: None   Other Topics Concern   ??? None   Social History Narrative   ??? None             Allergies:   No Known Allergies          Medications:     Current Outpatient Medications   Medication Sig Dispense Refill   ??? B cmplx 4/vit D3/C/folic/zinc (VITAL-D RX ORAL) Take by mouth.     ??? doxycycline (VIBRA-TABS) 100 MG tablet Take 1 tablet (100 mg total) by mouth Two (2) times a day. 180 tablet 1   ??? Lactobac #2-Bifido #1-S. therm (VISBIOME) 900 billion cell PwPk Take 2 packets by mouth two (2) times a day. 60 packet 2   ??? metroNIDAZOLE (FLAGYL) 500 MG tablet Take 1 tablet (500 mg total) by mouth two (2) times a day. 180 tablet 0   ??? multivitamin (THERAGRAN) per tablet Take 1 tablet by mouth daily.     ??? sod picosulf-mag ox-citric ac (CLENPIQ) 10 mg-3.5 gram -12 gram/160 mL Soln Take according to prep instructions 2 Bottle 0   ??? vancomycin (VANCOCIN) 125 MG capsule Take 1 capsule (125 mg total) by mouth two (2) times a day. 180 capsule 0   ??? vedolizumab (ENTYVIO IV) Infuse into a venous catheter every twenty-eight (28) days.      ??? tofacitinib (XELJANZ) 10 mg Tab Take 10 mg by mouth Two (2) times a day. 60 tablet 2   ??? varicella-zoster gE-AS01B, PF, (SHINGRIX, PF,) 50 mcg/0.5 mL SusR injection Inject 0.5 mL into the muscle once for 1 dose. Repeat second dose after 2-6 months 0.5 mL 0     No current facility-administered medications for this visit.              Physical Exam:   BP 116/64  - Pulse 53  - Temp 36.7 ??C (98.1 ??F)  - Resp 16  - Ht 188 cm (6' 2)  - Wt 88.5 kg (195 lb 1.6 oz)  - SpO2 100%  - BMI 25.05 kg/m??     BP Readings from Last 3 Encounters:   08/03/18 116/64   07/19/18 122/85   06/16/18 120/76      Wt Readings from Last 3 Encounters:   08/03/18 88.5 kg (195 lb 1.6 oz)   07/19/18 89 kg (196 lb 1.6 oz)   06/16/18 93 kg (205 lb)      BMI: Estimated body mass index is 25.05 kg/m?? as calculated from the following:    Height as of this encounter: 188 cm (6' 2).    Weight as of this encounter: 88.5 kg (195 lb 1.6 oz).    BSA: Estimated body surface area is 2.15 meters squared as calculated from the following:    Height as of this encounter: 188 cm (6' 2).    Weight as of this encounter: 88.5 kg (195 lb 1.6 oz).    Constitutional:   Alert, oriented x 3, no acute distress, well nourished, and well hydrated.   Mental Status:   Thought organized, appropriate affect, pleasantly interactive, not anxious appearing.   HEENT:   PEERL, conjunctiva clear, anicteric, oropharynx clear, neck supple, no LAD.   Respiratory: Clear to auscultation, unlabored breathing.     Cardiac: Euvolemic, regular rate and rhythm, normal S1 and S2, no murmur.     Abdomen: Soft, normal bowel sounds, non-distended, non-tender, no organomegaly or masses.     Perianal/Rectal Exam Not performed.     Extremities:  No edema, well perfused.   Musculoskeletal: No joint swelling or tenderness noted, no deformities.     Skin: No rashes, jaundice or skin lesions noted.     Neuro: No focal deficits.              Labs, Data & Indices:     Lab Review:       No visits with results within 1 Week(s) from this visit.   Latest known visit with results is:   Office Visit on 07/19/2018   Component Date Value Ref Range Status   ??? ALT 07/19/2018 19  <50 U/L Final   ??? AST 07/19/2018 30  17 - 47 U/L Final   ??? Alkaline Phosphatase 07/19/2018 41  38 - 126 U/L Final   ??? CRP 07/19/2018 <5.0  <10.0 mg/L Final   ??? WBC 07/19/2018 6.2  4.5 - 11.0 10*9/L Final   ??? RBC 07/19/2018 4.70  4.50 - 5.90 10*12/L Final   ??? HGB 07/19/2018 15.0  13.5 - 17.5 g/dL Final   ??? HCT 16/03/9603 45.3  41.0 - 53.0 % Final   ??? MCV 07/19/2018 96.5  80.0 - 100.0 fL Final   ??? MCH 07/19/2018 31.8  26.0 - 34.0 pg Final   ??? MCHC 07/19/2018 33.0  31.0 - 37.0 g/dL Final   ??? RDW 54/02/8118 13.1  12.0 - 15.0 % Final   ??? MPV 07/19/2018 7.6  7.0 - 10.0 fL Final   ??? Platelet 07/19/2018 331  150 - 440 10*9/L Final   ??? Neutrophils % 07/19/2018 68.4  % Final   ??? Lymphocytes % 07/19/2018 24.5  % Final   ??? Monocytes % 07/19/2018 3.8  % Final   ??? Eosinophils % 07/19/2018 0.4 % Final   ??? Basophils % 07/19/2018 1.0  % Final   ??? Absolute Neutrophils 07/19/2018 4.3  2.0 - 7.5 10*9/L Final   ??? Absolute Lymphocytes 07/19/2018 1.5  1.5 - 5.0 10*9/L Final   ??? Absolute Monocytes 07/19/2018 0.2  0.2 - 0.8 10*9/L Final   ??? Absolute Eosinophils 07/19/2018 0.0  0.0 - 0.4 10*9/L Final   ??? Absolute Basophils 07/19/2018 0.1  0.0 - 0.1 10*9/L Final   ??? Large Unstained Cells 07/19/2018 2  0 - 4 % Final           ..........................................................................................................................................Marland Kitchen  Modified Mayo Score for Ulcerative Colitis  Stool Frequency:  1 = 1-2 more than normal  Rectal bleeding:  0 = None  Physicians Global Assessment:  1 = Mild colitis  Score:  2  Remission <2  ............................................................................................................................................  ORDERS THIS VISIT:             Assessment & Recommendations:   Disease state:  Moderate to severe colitis, medically refractory    Eleuterio Dollar is a 33 y.o. male with hx of UC refractory to Remicade, Humira, Entyvio (currently second exposure q 4 weeks). Failure of MTX and 6-MP in the past.      UC diagnosis: The last colonoscopy showed pancolitis Mayo 1.  We reviewed today the bowel frequency data over the last 3 years which the patient collected and graphically  reproduced on his laptop in a diagram.  Overall the bowel frequency decreased over the last 3 years on antibiotic therapy and it is overall not obvious if Entyvio contributes to the overall improvement of bowel frequency or visible colonic inflammation. Given the current colonoscopy and loose consistency of the bowel movements we discussed that we should now try tofacitinib 10  mg bid induction dosing for at least 8 weeks.  If this therapy works we will stop Entyvio.  If it does not work after 8 weeks we will stop and  see if Thompson Grayer is still necessary and to decrease interval to every 8 weeks.  If this is not leading to any worsening of symptoms we will stop Entyvio and only continue antibiotic therapy.  This is a highly experimental approach and I personally hope that we can switch the patient successfully to tofacitinib monotherapy without the need of antibiotics.  I discussed with the patient the risk of PE and overall mortality with high-dose tofacitinib and it is planned to try to reduce tofacitinib in case of therapeutic success in maintenance on 5 mg twice daily.    Bone health: Vit D high normal (03/2018;71)    Labs: Normal CBC and LFTs beginning of February.    Health maintenance: Discussion of increased risk of shingles with tofacitinib.  I gave the patient a prescription for Shingrix which is normally not covered for patients below 50 but we will see if it is covered and otherwise the patient considers to pay for the vaccination himself.      IBD health maintenance:  Influenza vaccine:  02/2018  Pneumonia vaccine: 06/2017 Pneumovax  Hepatitis B:03/2018 (immune)  TB testing: 03/2018  Chickenpox/Shingles history: chickenpox positive  Bone denistometry:  no recent. Will check Vit D with next visit    Portions of this record have been created using Scientist, clinical (histocompatibility and immunogenetics). Dictation errors have been sought, but may not have been identified and corrected.              PLAN:    Patient Instructions   Plan to start tofacitinib 10 mg twice daily for 8 weeks. If no improvement after 8 weeks stop medication. If remission or improvement consider reducing tofacitinib to 5 mg bid and then stop Entyvio.    If no improvement and stop of tofacitinib consider Entyvio q 8 weeks and see if efficacy is different.    Labs after 4 weeks + lipid panel and after 8 weeks          Patient Education        tofacitinib  Pronunciation:  TOE fa SYE ti nib  Brand:  Ephriam Knuckles XR  What is the most important information I should know about tofacitinib?  You should not use tofacitinib if you have a serious infection. Before you start treatment, your doctor may perform tests to make sure you do not have an infection.  Tofacitinib affects your immune system. You may get infections more easily, even serious or fatal infections. Call your doctor if you have a fever, chills, aches, tiredness, cough, trouble breathing, skin sores, diarrhea, weight loss, or burning when you urinate.  If you've ever had hepatitis B or C, using tofacitinib can cause this virus to become active or get worse. Tell your doctor if you don't feel well and you have right-sided upper stomach pain, vomiting, loss of appetite, or yellowing of your skin or eyes.  What is tofacitinib?  Tofacitinib is used to treat moderate to severe rheumatoid arthritis or active psoriatic arthritis in adults who have tried methotrexate or other medications without successful treatment of symptoms. Tofacitinib is sometimes given in combination with methotrexate or other arthritis medicines.  Tofacitinib is also used to treat moderate to severe ulcerative colitis in adults who cannot use certain other medications, or after other treatments have failed. Extended-release tofacitinib (Xeljanz XR) is  not for use in treating ulcerative colitis.  Tofacitinib may also be used for purposes not listed in this medication guide.  What should I discuss with my healthcare provider before taking tofacitinib?  You should not use tofacitinib if you are allergic to it, or if you have any kind of infection.  Tell your doctor if you have ever had:  ?? liver disease (especially hepatitis B or C);  ?? heart problems (especially if you are 50 or older);  ?? lung disease;  ?? a blood clot;  ?? a chronic infection;  ?? HIV, or a weak immune system;  ?? a stomach or intestinal problem such as diverticulitis or an ulcer;  ?? kidney disease (or if you are on dialysis);  ?? diabetes; or  ?? if you are scheduled to receive any vaccine.  Tell your doctor if you have ever had tuberculosis or if anyone in your household has tuberculosis. Also tell your doctor if you have recently traveled. Tuberculosis and some fungal infections are more common in certain parts of the world, and you may have been exposed during travel.  Using tofacitinib may increase your risk of developing certain cancers, such as lymphoma or skin cancer. Ask your doctor about this risk.  It is not known whether this medicine will harm an unborn baby. Tell your doctor if you are pregnant or plan to become pregnant. Tofacitinib may affect your ability to have children during treatment and in the future.  If you are pregnant, your name may be listed on a pregnancy registry to track the effects of tofacitinib on the baby.  Do not breastfeed while you are using this medicine, and for at least 18 hours after your last dose (36 hours if you take extended-release tablets). If you use a breast pump during this time, do not feed the milk to your baby.  How should I take tofacitinib?  Before you start treatment, your doctor may perform tests to make sure you do not have an infection  Follow all directions on your prescription label and read all medication guides or instruction sheets. Use the medicine exactly as directed.  A safe dose of tofacitinib is not the same for all conditions. Avoid medication errors by using only the form and strength your doctor prescribes. You should not take extended-release tofacitinib (Xeljanz XR) to treat ulcerative colitis.  You may take tofacitinib with or without food.  Swallow the extended-release tablet whole and do not crush, chew, or break it.  Tofacitinib affects your immune system. You may get infections more easily, even serious or fatal infections.  If you've ever had shingles (herpes zoster) or hepatitis B or C, using tofacitinib can cause these viruses to become active or get worse.  You will need frequent medical tests.  Store in the original container at room temperature away from moisture and heat. Some tablets are made with a shell that is not absorbed or melted in the body. Part of this shell may appear in your stool. This is normal and will not make the medicine less effective.  What happens if I miss a dose?  Take the medicine as soon as you can, but skip the missed dose if it is almost time for your next dose. Do not take two doses at one time.  What happens if I overdose?  Seek emergency medical attention or call the Poison Help line at [phone].  What should I avoid while taking tofacitinib?  Do not receive a live vaccine while  using tofacitinib, or you could develop a serious infection. Live vaccines include measles, mumps, rubella (MMR), polio, rotavirus, typhoid, yellow fever, varicella (chickenpox), and zoster (shingles).  What are the possible side effects of tofacitinib?  Get emergency medical help if you have signs of an allergic reaction: hives; difficult breathing; swelling of your face, lips, tongue, or throat.  Some people taking high doses of tofacitinib have developed serious or fatal blood clots. Stop taking tofacitinib and seek emergency medical attention if you have:  ?? sudden shortness of breath;  ?? pain while breathing;  ?? cough with pink or red mucus;  ?? pain in your chest or back;  ?? clammy or blue-colored skin, heavy sweating; or  ?? pain, swelling, or redness in an arm or a leg.  You may get infections more easily, even serious or fatal infections. Call your doctor right away if you have signs of infection such as:  ?? fever, chills, sweating, tiredness, muscle pain;  ?? feeling short of breath;  ?? skin sores with warmth, redness, or swelling;  ?? increased urination, pain or burning when you urinate;  ?? mouth sores, stomach pain, diarrhea; or  ?? signs of tuberculosis: fever, cough, night sweats, loss of appetite, weight loss, and feeling very tired.  Further doses may be delayed until your infection clears up.  Also call your doctor at once if you have:  ?? low red blood cells (anemia) --pale skin, unusual tiredness, feeling light-headed or short of breath, cold hands and feet;  ?? signs of hepatitis --loss of appetite, vomiting, stomach pain (upper right side), dark urine, clay-colored stools, jaundice (yellowing of the skin or eyes);  ?? shingles --burning pain, numbness, tingling, itching, skin rash or blisters; or  ?? signs of perforation (a hole or tear) in your stomach or intestines --fever, ongoing stomach pain, change in bowel habits.  Common side effects may include:  ?? skin rash, shingles;  ?? increased blood pressure;  ?? abnormal blood tests;  ?? headache;  ?? diarrhea; or  ?? cold symptoms such as stuffy nose, sneezing, sore throat.  This is not a complete list of side effects and others may occur. Call your doctor for medical advice about side effects. You may report side effects to FDA at 1-800-FDA-1088.  What other drugs will affect tofacitinib?  Sometimes it is not safe to use certain medications at the same time. Some drugs can affect your blood levels of other drugs you take, which may increase side effects or make the medications less effective.  Tell your doctor about all your current medicines. Many drugs can affect tofacitinib, especially:  ?? azathioprine;  ?? cyclosporine; or  ?? other drugs to treat arthritis or ulcerative colitis --abatacept, adalimumab, anakinra, certolizumab, etanercept, golimumab, infliximab, rituximab, secukinumab, tocilizumab, ustekinumab, vedolizumab.  This list is not complete and many other drugs may affect tofacitinib. This includes prescription and over-the-counter medicines, vitamins, and herbal products. Not all possible drug interactions are listed here.  Where can I get more information?  Your doctor or pharmacist can provide more information about tofacitinib.  Remember, keep this and all other medicines out of the reach of children, never share your medicines with others, and use this medication only for the indication prescribed.  Every effort has been made to ensure that the information provided by [employer] ('[employer]') is accurate, up-to-date, and complete, but no guarantee is made to that effect. Drug information contained herein may be time sensitive. [employer] information has been compiled for  use by healthcare practitioners and consumers in the [place] and therefore [employer] does not warrant that uses outside of the [place] are appropriate, unless specifically indicated otherwise. [employer]'s drug information does not endorse drugs, diagnose patients or recommend therapy. [employer]'s drug information is an [employer], corporate to assist licensed healthcare practitioners in caring for their patients and/or to serve consumers viewing this service as a supplement to, and not a substitute for, the expertise, skill, knowledge and judgment of healthcare practitioners. The absence of a warning for a given drug or drug combination in no way should be construed to indicate that the drug or drug combination is safe, effective or appropriate for any given patient. [employer] does not assume any responsibility for any aspect of healthcare administered with the aid of information [employer] provides. The information contained herein is not intended to cover all possible uses, directions, precautions, warnings, drug interactions, allergic reactions, or adverse effects. If you have questions about the drugs you are taking, check with your doctor, nurse or pharmacist.  Copyright [phone] [employer] Version: 6.01. Revision date: 01/10/2018.  Care instructions adapted under license by [facility]. If you have questions about a medical condition or this instruction, always ask your healthcare professional. [employer] disclaims any warranty or liability for your use of this information.         --------------------------------------------  Today, I personally spent 50 minutes in direct face to face time with the patient, of which greater than 50% of the time was spent in patient education and counseling as described above.  AuthorTrula Slade 08/03/2018 5:49 PM

## 2018-08-03 ENCOUNTER — Encounter
Admit: 2018-08-03 | Discharge: 2018-08-04 | Payer: PRIVATE HEALTH INSURANCE | Attending: Gastroenterology | Primary: Gastroenterology

## 2018-08-03 DIAGNOSIS — K5100 Ulcerative (chronic) pancolitis without complications: Principal | ICD-10-CM

## 2018-08-03 DIAGNOSIS — Z79899 Other long term (current) drug therapy: Secondary | ICD-10-CM

## 2018-08-03 MED ORDER — XELJANZ 10 MG TABLET
ORAL_TABLET | Freq: Two times a day (BID) | ORAL | 0 refills | 0.00000 days | Status: CP
Start: 2018-08-03 — End: 2018-08-03
  Filled 2018-08-24: qty 60, 30d supply, fill #0

## 2018-08-03 MED ORDER — SHINGRIX (PF) 50 MCG/0.5 ML INTRAMUSCULAR SUSPENSION, KIT
Freq: Once | INTRAMUSCULAR | 0 refills | 0 days | Status: CP
Start: 2018-08-03 — End: 2018-08-03

## 2018-08-03 MED ORDER — XELJANZ 10 MG TABLET: 10 mg | tablet | Freq: Two times a day (BID) | 2 refills | 0 days | Status: AC

## 2018-08-03 NOTE — Unmapped (Signed)
Philip Barnett copay card given to patient and discussed voucher program. Will plan for start asap. Script sent to [facility] for processing and fill.  Patient verbalized understanding and in agreement with plan.

## 2018-08-03 NOTE — Unmapped (Signed)
Coordinate xeljanz start. Voucher sent today.  Script sent to [facility] for processing and fil. Patient has copay card on hand.

## 2018-08-03 NOTE — Unmapped (Addendum)
Plan to start tofacitinib 10 mg twice daily for 8 weeks. If no improvement after 8 weeks stop medication. If remission or improvement consider reducing tofacitinib to 5 mg bid and then stop Entyvio.    If no improvement and stop of tofacitinib consider Entyvio q 8 weeks and see if efficacy is different.    Labs after 4 weeks + lipid panel and after 8 weeks          Patient Education        tofacitinib  Pronunciation:  TOE fa SYE ti nib  Brand:  Ephriam Knuckles XR  What is the most important information I should know about tofacitinib?  You should not use tofacitinib if you have a serious infection. Before you start treatment, your doctor may perform tests to make sure you do not have an infection.  Tofacitinib affects your immune system. You may get infections more easily, even serious or fatal infections. Call your doctor if you have a fever, chills, aches, tiredness, cough, trouble breathing, skin sores, diarrhea, weight loss, or burning when you urinate.  If you've ever had hepatitis B or C, using tofacitinib can cause this virus to become active or get worse. Tell your doctor if you don't feel well and you have right-sided upper stomach pain, vomiting, loss of appetite, or yellowing of your skin or eyes.  What is tofacitinib?  Tofacitinib is used to treat moderate to severe rheumatoid arthritis or active psoriatic arthritis in adults who have tried methotrexate or other medications without successful treatment of symptoms. Tofacitinib is sometimes given in combination with methotrexate or other arthritis medicines.  Tofacitinib is also used to treat moderate to severe ulcerative colitis in adults who cannot use certain other medications, or after other treatments have failed. Extended-release tofacitinib (Xeljanz XR) is not for use in treating ulcerative colitis.  Tofacitinib may also be used for purposes not listed in this medication guide.  What should I discuss with my healthcare provider before taking tofacitinib?  You should not use tofacitinib if you are allergic to it, or if you have any kind of infection.  Tell your doctor if you have ever had:  ?? liver disease (especially hepatitis B or C);  ?? heart problems (especially if you are 50 or older);  ?? lung disease;  ?? a blood clot;  ?? a chronic infection;  ?? HIV, or a weak immune system;  ?? a stomach or intestinal problem such as diverticulitis or an ulcer;  ?? kidney disease (or if you are on dialysis);  ?? diabetes; or  ?? if you are scheduled to receive any vaccine.  Tell your doctor if you have ever had tuberculosis or if anyone in your household has tuberculosis. Also tell your doctor if you have recently traveled. Tuberculosis and some fungal infections are more common in certain parts of the world, and you may have been exposed during travel.  Using tofacitinib may increase your risk of developing certain cancers, such as lymphoma or skin cancer. Ask your doctor about this risk.  It is not known whether this medicine will harm an unborn baby. Tell your doctor if you are pregnant or plan to become pregnant. Tofacitinib may affect your ability to have children during treatment and in the future.  If you are pregnant, your name may be listed on a pregnancy registry to track the effects of tofacitinib on the baby.  Do not breastfeed while you are using this medicine, and for at least 18 hours after  your last dose (36 hours if you take extended-release tablets). If you use a breast pump during this time, do not feed the milk to your baby.  How should I take tofacitinib?  Before you start treatment, your doctor may perform tests to make sure you do not have an infection  Follow all directions on your prescription label and read all medication guides or instruction sheets. Use the medicine exactly as directed.  A safe dose of tofacitinib is not the same for all conditions. Avoid medication errors by using only the form and strength your doctor prescribes. You should not take extended-release tofacitinib (Xeljanz XR) to treat ulcerative colitis.  You may take tofacitinib with or without food.  Swallow the extended-release tablet whole and do not crush, chew, or break it.  Tofacitinib affects your immune system. You may get infections more easily, even serious or fatal infections.  If you've ever had shingles (herpes zoster) or hepatitis B or C, using tofacitinib can cause these viruses to become active or get worse.  You will need frequent medical tests.  Store in the original container at room temperature away from moisture and heat.  Some tablets are made with a shell that is not absorbed or melted in the body. Part of this shell may appear in your stool. This is normal and will not make the medicine less effective.  What happens if I miss a dose?  Take the medicine as soon as you can, but skip the missed dose if it is almost time for your next dose. Do not take two doses at one time.  What happens if I overdose?  Seek emergency medical attention or call the Poison Help line at [phone].  What should I avoid while taking tofacitinib?  Do not receive a live vaccine while using tofacitinib, or you could develop a serious infection. Live vaccines include measles, mumps, rubella (MMR), polio, rotavirus, typhoid, yellow fever, varicella (chickenpox), and zoster (shingles).  What are the possible side effects of tofacitinib?  Get emergency medical help if you have signs of an allergic reaction: hives; difficult breathing; swelling of your face, lips, tongue, or throat.  Some people taking high doses of tofacitinib have developed serious or fatal blood clots. Stop taking tofacitinib and seek emergency medical attention if you have:  ?? sudden shortness of breath;  ?? pain while breathing;  ?? cough with pink or red mucus;  ?? pain in your chest or back;  ?? clammy or blue-colored skin, heavy sweating; or  ?? pain, swelling, or redness in an arm or a leg.  You may get infections more easily, even serious or fatal infections. Call your doctor right away if you have signs of infection such as:  ?? fever, chills, sweating, tiredness, muscle pain;  ?? feeling short of breath;  ?? skin sores with warmth, redness, or swelling;  ?? increased urination, pain or burning when you urinate;  ?? mouth sores, stomach pain, diarrhea; or  ?? signs of tuberculosis: fever, cough, night sweats, loss of appetite, weight loss, and feeling very tired.  Further doses may be delayed until your infection clears up.  Also call your doctor at once if you have:  ?? low red blood cells (anemia) --pale skin, unusual tiredness, feeling light-headed or short of breath, cold hands and feet;  ?? signs of hepatitis --loss of appetite, vomiting, stomach pain (upper right side), dark urine, clay-colored stools, jaundice (yellowing of the skin or eyes);  ?? shingles --burning pain, numbness, tingling,  itching, skin rash or blisters; or  ?? signs of perforation (a hole or tear) in your stomach or intestines --fever, ongoing stomach pain, change in bowel habits.  Common side effects may include:  ?? skin rash, shingles;  ?? increased blood pressure;  ?? abnormal blood tests;  ?? headache;  ?? diarrhea; or  ?? cold symptoms such as stuffy nose, sneezing, sore throat.  This is not a complete list of side effects and others may occur. Call your doctor for medical advice about side effects. You may report side effects to FDA at 1-800-FDA-1088.  What other drugs will affect tofacitinib?  Sometimes it is not safe to use certain medications at the same time. Some drugs can affect your blood levels of other drugs you take, which may increase side effects or make the medications less effective.  Tell your doctor about all your current medicines. Many drugs can affect tofacitinib, especially:  ?? azathioprine;  ?? cyclosporine; or  ?? other drugs to treat arthritis or ulcerative colitis --abatacept, adalimumab, anakinra, certolizumab, etanercept, golimumab, infliximab, rituximab, secukinumab, tocilizumab, ustekinumab, vedolizumab.  This list is not complete and many other drugs may affect tofacitinib. This includes prescription and over-the-counter medicines, vitamins, and herbal products. Not all possible drug interactions are listed here.  Where can I get more information?  Your doctor or pharmacist can provide more information about tofacitinib.  Remember, keep this and all other medicines out of the reach of children, never share your medicines with others, and use this medication only for the indication prescribed.  Every effort has been made to ensure that the information provided by [employer] ('[employer]') is accurate, up-to-date, and complete, but no guarantee is made to that effect. Drug information contained herein may be time sensitive. [employer] information has been compiled for use by healthcare practitioners and consumers in the [place] and therefore [employer] does not warrant that uses outside of the [place] are appropriate, unless specifically indicated otherwise. Mult[employer]'s drug information does not endorse drugs, diagnose patients or recommend therapy. [employer]'s drug information is an [employer], corporate to assist licensed healthcare practitioners in caring for their patients and/or to serve consumers viewing this service as a supplement to, and not a substitute for, the expertise, skill, knowledge and judgment of healthcare practitioners. The absence of a warning for a given drug or drug combination in no way should be construed to indicate that the drug or drug combination is safe, effective or appropriate for any given patient. Mult[employer] does not assume any responsibility for any aspect of healthcare administered with the aid of information Mult[employer] provides. The information contained herein is not intended to cover all possible uses, directions, precautions, warnings, drug interactions, allergic reactions, or adverse effects. If you have questions about the drugs you are taking, check with your doctor, nurse or pharmacist.  Copyright [id number])121-1934 [employer] Version: 6.01. Revision date: 01/10/2018.  Care instructions adapted under license by [facility]. If you have questions about a medical condition or this instruction, always ask your healthcare professional. [employer] disclaims any warranty or liability for your use of this information.

## 2018-08-08 NOTE — Unmapped (Signed)
PA was submitted by MAP team on 3/2 through [facility].  Patient Philip Barnett has 30day voucher by now and will be able to proceed.  Will follow up with patient to ensure he is able to get started.

## 2018-08-14 NOTE — Unmapped (Signed)
[facility] Specialty Medication Referral: PA Approved      Medication (Brand/Generic): Harriette Ohara    Final [employer] completed with resulted information below:    Patient ABLE to fill at [facility]  Insurance Company:  BCBS   Anticipated Copay: $0  Is anticipated copay with a copay card or grant? No, there is no need for grant or copay assistance.     Does patient's insurance plan only allow a 15 day supply for the first 6 fills in the Split Fill Program? No  If yes, inform patient they can request to dis-enroll from the [facility] by calling the patient help desk at N/A.      If the copay is under the $25 defined limit, per policy there will be no further investigation of need for financial assistance at this time unless patient requests. This referral has been communicated to the provider and handed off to the [facility] team for further processing and filling of prescribed medication.   ______________________________________________________________________  Please utilize this referral for viewing purposes as it will serve as the central location for all relevant documentation and updates.

## 2018-08-14 NOTE — Unmapped (Signed)
[facility]   Patient Onboarding/Medication Counseling    Philip Barnett is a 33 y.o. male with ulcerative colitis who I am counseling today on initiation of therapy.  I am speaking to the patient    Verified patient's date of birth / HIPAA.    Specialty medication(s) to be sent: Inflammatory Disorders: Harriette Ohara      Non-specialty medications/supplies to be sent: none         Xeljanz (tofacitinib)    Medication & Administration     Dosage: Ulcerative colitis (induction treatment): Take 10mg  by mouth twice daily    Lab tests required prior to treatment initiation:  ? Tuberculosis: Tuberculosis screening resulted in a non-reactive Quantiferon TB Gold assay.  ? Hepatitis B: Hepatitis B serology studies are complete and non-reactive.      Administration:  Take tablets by mouth with or without food.  For extended release tablets ??? swallow intact, do not crush, split, or chew.    Adherence/Missed dose instructions:  Take a missed dose as soon as you remember it if it's been less than 6 hours (IR tabs) or 12 hours (XR tabs) from your normal dosing time.  Never take 2 doses to try and catch up from a missed dose.    Goals of Therapy     ? Achieve remission of symptoms  ? Maintain remission of symptoms  ? Minimize long-term systemic glucocorticoid use  ? Prevent need for surgical procedures  ? Maintenance of effective psychosocial functioning      Side Effects & Monitoring Parameters     ? Signs of a common cold ??? minor sore throat, runny or stuffy nose, etc.  ? Headache  ? Diarrhea    The following side effects should be reported to the provider:  ? Signs of a hypersensitivity reaction ??? rash; hives; itching; red, swollen, blistered, or peeling skin; wheezing; tightness in the chest or throat; difficulty breathing, swallowing, or talking; swelling of the mouth, face, lips, tongue, or throat; etc.  ? Reduced immune function ??? report signs of infection such as fever; chills; body aches; very bad sore throat; ear or sinus pain; cough; more sputum or change in color of sputum; pain with passing urine; wound that will not heal, etc.  Also at a slightly higher risk of some malignancies (mainly skin and blood cancers) due to this reduced immune function.  o In the case of signs of infection ??? the patient should hold the next dose of Xeljanz?? and call your primary care provider to ensure adequate medical care.  Treatment may be resumed when infection is treated and patient is asymptomatic.  ? Changes in skin ??? a new growth or lump that forms; changes in shape, size, or color of a previous mole or marking  ? Swelling or pain in your stomach that is very bad, gets worse, or does not go away  ? Any signs of GI bleed ??? blood in vomit or stool  ? Heartbeat that does not feel normal  ? Shortness of breath      Contraindications, Warnings, & Precautions     ? Dosage adjustment may be needed if used concomitantly with other CYP3A4 substrates  ? Have your bloodwork checked as you have been told by your prescriber  ? Talk with your doctor if you are pregnant, planning to become pregnant, or breastfeeding  ? Discuss the possible need for holding your dose(s) of Humira?? when a planned procedure is scheduled with the prescriber as it  may delay healing/recovery timeline     Drug/Food Interactions     ? Medication list reviewed in Epic. The patient was instructed to inform the care team before taking any new medications or supplements. No drug interactions identified.   ? This medication can interact with grapefruit, star fruit, and [place] oranges  ? Talk with you prescriber or pharmacist before receiving any live vaccinations while taking this medication and after you stop taking it      Storage, Handling Precautions, & Disposal     ? Store in original container  ? Store at room temperature  ? Avoid moisture      Current Medications (including OTC/herbals), Comorbidities and Allergies     Current Outpatient Medications   Medication Sig Dispense Refill   ??? cholecalciferol, vitamin D3, 10,000 unit capsule Take 10,000 Units by mouth daily.     ??? doxycycline (VIBRA-TABS) 100 MG tablet Take 100 mg by mouth daily.     ??? Lactobac #2-Bifido #1-S. therm (VISBIOME) 900 billion cell PwPk Take 2 packets by mouth two (2) times a day. 60 packet 2   ??? metroNIDAZOLE (FLAGYL) 500 MG tablet Take 500 mg by mouth daily.     ??? tofacitinib (XELJANZ) 10 mg Tab Take 1 tablet (10 mg) by mouth Two (2) times a day. 60 tablet 2   ??? vedolizumab (ENTYVIO IV) Infuse into a venous catheter every twenty-eight (28) days.        No current facility-administered medications for this visit.        No Known Allergies    Patient Active Problem List   Diagnosis   ??? Colitis   ??? Ulcerative colitis (CMS-HCC)       Reviewed and up to date in Epic.    Appropriateness of Therapy     Is medication and dose appropriate based on diagnosis? Yes    Baseline Quality of Life Assessment      How many days over the past month did your ulcerative colitis keep you from your normal activities? ~30    Financial Information     Medication Assistance provided: Prior Authorization    Anticipated copay of $0.00 reviewed with patient. Verified delivery address.    Delivery Information     Scheduled delivery date: 08/25/2018    Expected start date: already started ~7 days ago with starter pack sent from [facility]    Medication will be delivered via Next Day Courier to the home address in [place].  This shipment will not require a signature.      Explained the services we provide at [facility] and that each month we would call to set up refills.  Stressed importance of returning phone calls so that we could ensure they receive their medications in time each month.  Informed patient that we should be setting up refills 7-10 days prior to when they will run out of medication.  A pharmacist will reach out to perform a clinical assessment periodically.  Informed patient that a welcome packet and a drug information handout will be sent.      Patient verbalized understanding of the above information as well as how to contact the pharmacy at [phone] option 4 with any questions/concerns.  The pharmacy is open Monday through Friday 8:30am-4:30pm.  A pharmacist is available 24/7 via pager to answer any clinical questions they may have.    Patient Specific Needs     ? Does the patient have any physical, cognitive, or cultural  barriers? No    ? Patient prefers to have medications discussed with  Patient     ? Is the patient able to read and understand education materials at a high school level or above? Yes    ? Patient's primary language is  English     ? Is the patient high risk? No     ? Does the patient require a Care Management Plan? No     ? Does the patient require physician intervention or other additional services (i.e. nutrition, smoking cessation, social work)? No      Karene Fry Chan Sheahan   [facility] Specialty Pharmacist

## 2018-08-16 ENCOUNTER — Ambulatory Visit: Admit: 2018-08-16 | Discharge: 2018-08-17 | Payer: PRIVATE HEALTH INSURANCE

## 2018-08-16 DIAGNOSIS — K529 Noninfective gastroenteritis and colitis, unspecified: Principal | ICD-10-CM

## 2018-08-16 DIAGNOSIS — K5010 Crohn's disease of large intestine without complications: Principal | ICD-10-CM

## 2018-08-16 DIAGNOSIS — K5100 Ulcerative (chronic) pancolitis without complications: Principal | ICD-10-CM

## 2018-08-16 LAB — CBC W/ AUTO DIFF
BASOPHILS ABSOLUTE COUNT: 0.1 10*9/L (ref 0.0–0.1)
BASOPHILS RELATIVE PERCENT: 1.7 %
EOSINOPHILS ABSOLUTE COUNT: 0.1 10*9/L (ref 0.0–0.4)
EOSINOPHILS RELATIVE PERCENT: 2.1 %
HEMOGLOBIN: 14.2 g/dL — AB (ref 13.5–17.5)
LARGE UNSTAINED CELLS: 3 % (ref 0–4)
LYMPHOCYTES ABSOLUTE COUNT: 2.1 10*9/L (ref 1.5–5.0)
LYMPHOCYTES RELATIVE PERCENT: 51.7 %
MEAN CORPUSCULAR HEMOGLOBIN CONC: 31.9 g/dL (ref 31.0–37.0)
MEAN CORPUSCULAR VOLUME: 98 fL (ref 80.0–100.0)
MEAN PLATELET VOLUME: 8.1 fL (ref 7.0–10.0)
MONOCYTES ABSOLUTE COUNT: 0.2 10*9/L (ref 0.2–0.8)
MONOCYTES RELATIVE PERCENT: 4.7 %
NEUTROPHILS ABSOLUTE COUNT: 1.5 10*9/L — ABNORMAL LOW (ref 2.0–7.5)
NEUTROPHILS RELATIVE PERCENT: 36.9 %
PLATELET COUNT: 299 10*9/L (ref 150–440)
RED BLOOD CELL COUNT: 4.55 10*12/L (ref 4.50–5.90)
RED CELL DISTRIBUTION WIDTH: 13 % (ref 12.0–15.0)
WBC ADJUSTED: 4 10*9/L — ABNORMAL LOW (ref 4.5–11.0)

## 2018-08-16 NOTE — Unmapped (Signed)
Pt presents in clinic for entyvio infusion.  Alert, oriented X3.  Denies any pain, fever, infection.  PIV started on right hand .  Tolerated well.  1006 Entyvio IV infusion started.   1040  Entyvio IV infusion completed.  IV dc'd.  Tolerated well.  Discharged ambulatory.  In no acute distress.

## 2018-08-21 DIAGNOSIS — Z79899 Other long term (current) drug therapy: Principal | ICD-10-CM

## 2018-08-21 DIAGNOSIS — K5100 Ulcerative (chronic) pancolitis without complications: Principal | ICD-10-CM

## 2018-08-21 NOTE — Unmapped (Signed)
Lab order

## 2018-08-24 MED FILL — XELJANZ 10 MG TABLET: 30 days supply | Qty: 60 | Fill #0 | Status: AC

## 2018-09-13 ENCOUNTER — Encounter: Admit: 2018-09-13 | Discharge: 2018-09-14 | Payer: PRIVATE HEALTH INSURANCE

## 2018-09-13 DIAGNOSIS — K5100 Ulcerative (chronic) pancolitis without complications: Principal | ICD-10-CM

## 2018-09-13 DIAGNOSIS — K529 Noninfective gastroenteritis and colitis, unspecified: Secondary | ICD-10-CM

## 2018-09-13 LAB — CBC W/ AUTO DIFF
BASOPHILS ABSOLUTE COUNT: 0 10*9/L (ref 0.0–0.1)
BASOPHILS RELATIVE PERCENT: 1.2 %
EOSINOPHILS ABSOLUTE COUNT: 0.1 10*9/L (ref 0.0–0.4)
EOSINOPHILS RELATIVE PERCENT: 1.7 %
HEMATOCRIT: 42.7 % (ref 41.0–53.0)
HEMOGLOBIN: 14.2 g/dL (ref 13.5–17.5)
LYMPHOCYTES ABSOLUTE COUNT: 1.6 10*9/L (ref 1.5–5.0)
LYMPHOCYTES RELATIVE PERCENT: 44.4 %
MEAN CORPUSCULAR HEMOGLOBIN CONC: 33.2 g/dL (ref 31.0–37.0)
MEAN CORPUSCULAR HEMOGLOBIN: 32.4 pg (ref 26.0–34.0)
MEAN CORPUSCULAR VOLUME: 97.7 fL (ref 80.0–100.0)
MEAN PLATELET VOLUME: 7.7 fL (ref 7.0–10.0)
MONOCYTES ABSOLUTE COUNT: 0.2 10*9/L (ref 0.2–0.8)
NEUTROPHILS ABSOLUTE COUNT: 1.6 10*9/L — ABNORMAL LOW (ref 2.0–7.5)
NEUTROPHILS RELATIVE PERCENT: 43 %
PLATELET COUNT: 297 10*9/L (ref 150–440)
RED BLOOD CELL COUNT: 4.37 10*12/L — ABNORMAL LOW (ref 4.50–5.90)
RED CELL DISTRIBUTION WIDTH: 13.1 % (ref 12.0–15.0)
WBC ADJUSTED: 3.7 10*9/L — ABNORMAL LOW (ref 4.5–11.0)

## 2018-09-13 LAB — ALT: ALT (SGPT): 33 U/L (ref <50)

## 2018-09-13 NOTE — Unmapped (Signed)
0935 Pt is here for the Entyvio infusion, denies any changes from the last treatment, is AAOX3 upon arrival, vitals checked  and call bell at the chair side and pt instructed to use it when needed, pt verbalizes understanding  0941 IV # 24 initiated to the rt hand and flushed, no pre-meds  1005 IV Entyvio 300 mg/ 280 ml started to infuse over 30 min  1035 Iv infusion done and flushed,denies any s/s of allergic reaction, tolerated the infusion well   1038 Iv discontinued and dressing applied, pt given follow up instructions and is stable upon discharge

## 2018-09-14 NOTE — Unmapped (Signed)
Mr. Kossman mentioned that his Philip Barnett is being held at this point in time, and possibly will be stopped altogether.  He reported little to no benefit while on the medication ~6 weeks and did not want the additional infection risk in light of the current COVID19 pandemic.  Will reassess chart in ~1 month and then disenroll from Specialty Pharmacy services at that point in time if the medication is still being held.

## 2018-10-04 NOTE — Unmapped (Signed)
Patient given instructions on video visit and chart updated. Patient states understanding of instructions

## 2018-10-04 NOTE — Unmapped (Signed)
FOLLOWUP NOTE - INFLAMMATORY BOWEL DISEASE  10/05/2018    Demographics:  Braulio Kiedrowski is a 33 y.o. year old male    Diagnosis:  Ulcerative Colitis  Disease onset (yr): 2012  Age at onset:   58-40 yr old (A2)  Location:  Extensive (E3)  Behavior:  Colitis            HPI / NOTE :     HPI: Currently doing on a timed schedule of antibiotics (500 mg Flagyl twice daily for 1 week followed by 2050 mg Flagyl for 1 week +100 mg doxycycline daily and restart then on high-dose Flagyl and if needed additional vancomycin in case of increase of symptoms)and Entyvio q 4 weeks.  Currently has slightly increased bowel frequency more in the range 3-4 bowel movements daily instead of 2-3.  The start of Harriette Ohara did not improve the bowel frequency at all.  We stopped it after 4 weeks due to COVID-19 and no success at that point.  Otherwise no excellent test manifestations of inflammatory bowel disease, stable weight.      Review of Systems:   Review of systems positive for: negative except as above.   Otherwise, the balance of 10 systems is negative.          IBD HISTORY:     Year of disease onset:  2012    Brief IBD Disease Course:    - 2012 - onset of gut problems with stool frequency and diarrhea.  Saw PCP - ?Giardia or infection but didn't improve.   - 2013 - saw GI, dx with colitis in spring 2013.  Asacol and other 5-ASA's didn't work.  Established care at [facility]  Treated with prednisone and then started on Infliximab in February 2013.  Was on with this. Had partial response initially, later on dose increase but then loss of response with low titer antibodies. Changed to Humira in June 2014 with gradual improvement, esp after increasing to weekly dosing.  Changed to MTX due to elevated LFTs with .   - 2016 - worsening symptoms in fall 2016.  Uceris was added, still not feeling great.  Started on Entyvio 08/05/15 (but continued to take Humira through April). Fall of 2017 stop of Entyvio  - 2018 - followed with various providers, tried various regimens of antibiotics, interested in FMT or MAP therapy. Some success on vancomycin and metronidazole, but metronidazole had to be stopped due to peripheral neuropathy (tingeling in feet in the night).   07/2017 restart Entyvio, around 7 /2019 increase to every 4 weeks. 08/2018 start tofacitinib, stop after 4 weeks due to no significant benefit and COVID         Endoscopy:      Colonoscopy 05/2018 :  Normal terminal ileum.                       - Mild pan-ulcerative colitis (Mayo 1). Improved from                        prior. Biopsied.    A: Colon, right, biopsy  - Severe chronic active colitis with ulceration and inflamed granulation tissue, no dysplasia identified  - No CMV viral cytopathic effect or granulomas identified  ??  B: Colon, left, biopsy  - Moderate to severe chronic active colitis, negative for dysplasia  - No CMV viral cytopathic effect or granulomas identified    Colonoscopy 05/2017 moderate inflammation throughout colon Pan UC. Terminal  ileum normal.    Colonoscopy 10/09/15 - diffuse moderate (?Mayo 2) pancolitis, normal ileum     Imaging:    - CT A/P 12/15/11 - inflammatory changes of colon with wall thickening and loss of haustra. TI with mild thickening distally (?backwash).     Prior IBD medications (type, dose, duration, response):  x 5-ASAs - Asacol and others  x Oral corticosteroids  - yes, limited effect  ? Intravenous corticosteroids  XAntibiotics  x Thiopurines - - dose limited by elevated LFTs  x Methotrexate - intense side effects, gastric and other  x Anti-TNF therapies - IFX 2013 - 09/2012.  Partial response, low level Ab.  Humira started 09/2012 - stopped 09/2015.   X Anti-Integrin therapies - Vedolizumab start 08/05/15 limited response after 12 weeks.   ? Cyclosporine  ? Clinical trial medication  ? Other (Please specify):          Past Medical History:   Past medical history:   Past Medical History:   Diagnosis Date   ??? Crohn's colitis (CMS-HCC)      Past surgical history:   Past Surgical History:   Procedure Laterality Date   ??? ARTHROSCOPIC REPAIR ACL Left 2008   ??? FINGER SURGERY Left 2017   ??? PR COLONOSCOPY W/BIOPSY SINGLE/MULTIPLE Left 12/23/2014    Procedure: COLONOSCOPY, FLEXIBLE, PROXIMAL TO SPLENIC FLEXURE; WITH BIOPSY, SINGLE OR MULTIPLE;  Surgeon: Zetta Bills, MD;  Location: [facility];  Service: Gastroenterology   ??? PR COLONOSCOPY W/BIOPSY SINGLE/MULTIPLE Left 10/09/2015    Procedure: COLONOSCOPY, FLEXIBLE, PROXIMAL TO SPLENIC FLEXURE; WITH BIOPSY, SINGLE OR MULTIPLE;  Surgeon: Luanne Bras, MD;  Location: [facility];  Service: Gastroenterology   ??? PR COLONOSCOPY W/BIOPSY SINGLE/MULTIPLE Left 05/26/2018    Procedure: COLONOSCOPY, FLEXIBLE, PROXIMAL TO SPLENIC FLEXURE; WITH BIOPSY, SINGLE OR MULTIPLE;  Surgeon: Modena Nunnery, MD;  Location: [facility];  Service: Gastroenterology   ??? TONSILLECTOMY       Family history:   Family History   Problem Relation Age of Onset   ??? Ulcerative colitis Father    ??? Crohn's disease Neg Hx    ??? Colorectal Cancer Neg Hx    ??? Celiac disease Neg Hx    ??? Wilson's disease Neg Hx    ??? Liver cancer Neg Hx    ??? Cirrhosis Neg Hx      Social history:   Social History     Socioeconomic History   ??? Marital status: Single     Spouse name: None   ??? Number of children: None   ??? Years of education: None   ??? Highest education level: None   Occupational History   ??? None   Social Needs   ??? Financial resource strain: None   ??? Food insecurity     Worry: None     Inability: None   ??? Transportation needs     Medical: None     Non-medical: None   Tobacco Use   ??? Smoking status: Never Smoker   ??? Smokeless tobacco: Never Used   Substance and Sexual Activity   ??? Alcohol use: Yes     Alcohol/week: 0.0 standard drinks     Comment: very rarely   ??? Drug use: No   ??? Sexual activity: None   Lifestyle   ??? Physical activity     Days per week: None     Minutes per session: None   ??? Stress: None   Relationships   ???  Social Wellsite geologist on phone: None     Gets together: None     Attends religious service: None     Active member of club or organization: None     Attends meetings of clubs or organizations: None     Relationship status: None   Other Topics Concern   ??? None   Social History Narrative   ??? None             Allergies:   No Known Allergies          Medications:     Current Outpatient Medications   Medication Sig Dispense Refill   ??? cholecalciferol, vitamin D3, 10,000 unit capsule Take 10,000 Units by mouth daily.     ??? doxycycline (VIBRA-TABS) 100 MG tablet Take 100 mg by mouth daily.     ??? Lactobac #2-Bifido #1-S. therm (VISBIOME) 900 billion cell PwPk Take 2 packets by mouth two (2) times a day. 60 packet 2   ??? metroNIDAZOLE (FLAGYL) 500 MG tablet Take 500 mg by mouth daily.     ??? vedolizumab (ENTYVIO IV) Infuse into a venous catheter every twenty-eight (28) days.      ??? clarithromycin (BIAXIN) 500 MG tablet Take 1 tablet (500 mg total) by mouth every twelve (12) hours. 60 tablet 0     No current facility-administered medications for this visit.              Physical Exam:   There were no vitals taken for this visit.    BP Readings from Last 3 Encounters:   09/13/18 109/67   08/16/18 103/65   08/03/18 116/64      Wt Readings from Last 3 Encounters:   09/13/18 92.5 kg (204 lb)   08/16/18 89.4 kg (197 lb 3.2 oz)   08/03/18 88.5 kg (195 lb 1.6 oz)      BMI: Estimated body mass index is 26.19 kg/m?? as calculated from the following:    Height as of 08/03/18: 188 cm (6' 2).    Weight as of 09/13/18: 92.5 kg (204 lb).    BSA: Estimated body surface area is 2.2 meters squared as calculated from the following:    Height as of 08/03/18: 188 cm (6' 2).    Weight as of 09/13/18: 92.5 kg (204 lb).    Televisit          Labs, Data & Indices:     Lab Review:       No visits with results within 1 Week(s) from this visit.   Latest known visit with results is:   Office Visit on 09/13/2018 Component Date Value Ref Range Status   ??? ALT 09/13/2018 33  <50 U/L Final   ??? AST 09/13/2018 33  17 - 47 U/L Final   ??? Alkaline Phosphatase 09/13/2018 45  38 - 126 U/L Final   ??? CRP 09/13/2018 <5.0  <10.0 mg/L Final   ??? WBC 09/13/2018 3.7* 4.5 - 11.0 10*9/L Final   ??? RBC 09/13/2018 4.37* 4.50 - 5.90 10*12/L Final   ??? HGB 09/13/2018 14.2  13.5 - 17.5 g/dL Final   ??? HCT 28/41/3244 42.7  41.0 - 53.0 % Final   ??? MCV 09/13/2018 97.7  80.0 - 100.0 fL Final   ??? MCH 09/13/2018 32.4  26.0 - 34.0 pg Final   ??? MCHC 09/13/2018 33.2  31.0 - 37.0 g/dL Final   ??? RDW 06/09/7251 13.1  12.0 - 15.0 % Final   ???  MPV 09/13/2018 7.7  7.0 - 10.0 fL Final   ??? Platelet 09/13/2018 297  150 - 440 10*9/L Final   ??? Neutrophils % 09/13/2018 43.0  % Final   ??? Lymphocytes % 09/13/2018 44.4  % Final   ??? Monocytes % 09/13/2018 6.5  % Final   ??? Eosinophils % 09/13/2018 1.7  % Final   ??? Basophils % 09/13/2018 1.2  % Final   ??? Absolute Neutrophils 09/13/2018 1.6* 2.0 - 7.5 10*9/L Final   ??? Absolute Lymphocytes 09/13/2018 1.6  1.5 - 5.0 10*9/L Final   ??? Absolute Monocytes 09/13/2018 0.2  0.2 - 0.8 10*9/L Final   ??? Absolute Eosinophils 09/13/2018 0.1  0.0 - 0.4 10*9/L Final   ??? Absolute Basophils 09/13/2018 0.0  0.0 - 0.1 10*9/L Final   ??? Large Unstained Cells 09/13/2018 3  0 - 4 % Final   ??? Macrocytosis 09/13/2018 Slight* Not Present Final           ..........................................................................................................................................Marland Kitchen  Modified Mayo Score for Ulcerative Colitis  Stool Frequency:  2 = 3-4 more than normal  Rectal bleeding:  0 = None  Physicians Global Assessment:  1 = Mild colitis  Score:  3  Remission <2  ............................................................................................................................................  ORDERS THIS VISIT:             Assessment & Recommendations:   Disease state:  Moderate to severe colitis, medically refractory    Kaylee Wombles is a 33 y.o. male with hx of UC refractory to Remicade, Humira, Entyvio (currently second exposure q 4 weeks), Harriette Ohara (stop after 4 weeks due to no success and COVID 19). Failure of MTX and 6-MP in the past.      UC diagnosis: The last colonoscopy showed pancolitis Mayo 1, and he did quite well at that time point.  The addition of tofacitinib did not improve bowel frequency or general wellbeing and we stopped it after 4 weeks due to the COVID-19 situation.  I discussed that we can we discussed tofacitinib at a later time point and that there is still a good chance of response if you treat for about 16 weeks.  If then is no response tofacitinib is probably not successful.  We also discussed Stelara.  However should be started of Stelara we need to stop Entyvio which probably partially helps in the moment.    Patient is asked about a macrolide to see if this is further improving his intestinal dysbiosis  and I said I am open to it and we can try 1 week of Bactramycin and see if this is improving his bowel frequency.    Bone health: Vit D high normal (03/2018;71)    Labs: Normal CBC and LFTs April.        IBD health maintenance:  Influenza vaccine:  02/2018  Pneumonia vaccine: 06/2017 Pneumovax  Hepatitis B:03/2018 (immune)  TB testing: 03/2018  Chickenpox/Shingles history: chickenpox positive  Bone denistometry:  no recent. Will check Vit D with next visit    Portions of this record have been created using Scientist, clinical (histocompatibility and immunogenetics). Dictation errors have been sought, but may not have been identified and corrected.    I spent  23 minutes on the audio/video with the patient. I spent an additional 20  minutes on pre- and post-visit activities.     The patient was physically located in [place] or a state in which I am permitted to provide care. The patient and/or parent/gauardian understood that s/he may incur co-pays and cost sharing, and  agreed to the telemedicine visit. The visit was completed via phone and/or video, which was appropriate and reasonable under the circumstances given the patient's presentation at the time.    The patient and/or parent/guardian has been advised of the potential risks and limitations of this mode of treatment (including, but not limited to, the absence of in-person examination) and has agreed to be treated using telemedicine. The patient's/patient's family's questions regarding telemedicine have been answered.     If the phone/video visit was completed in an ambulatory setting, the patient and/or parent/guardian has also been advised to contact their provider???s office for worsening conditions, and seek emergency medical treatment and/or call 911 if the patient deems either necessary.               PLAN:    Patient Instructions   Try clarithromycin 500 mg bid for 1 week and see if this is more succesful then doxycycline. You can try monotherapy or in combination with low dose flagyl.    We will add Vit D to next lab draw.       --------------------------------------------  Today, I personally spent 50 minutes in direct face to face time with the patient, of which greater than 50% of the time was spent in patient education and counseling as described above.  AuthorTrula Slade 10/05/2018 5:12 PM

## 2018-10-05 ENCOUNTER — Encounter
Admit: 2018-10-05 | Discharge: 2018-10-06 | Payer: PRIVATE HEALTH INSURANCE | Attending: Gastroenterology | Primary: Gastroenterology

## 2018-10-05 DIAGNOSIS — K5100 Ulcerative (chronic) pancolitis without complications: Secondary | ICD-10-CM

## 2018-10-05 DIAGNOSIS — Z79899 Other long term (current) drug therapy: Principal | ICD-10-CM

## 2018-10-05 MED ORDER — CLARITHROMYCIN 500 MG TABLET
ORAL_TABLET | Freq: Two times a day (BID) | ORAL | 0 refills | 0.00000 days | Status: CP
Start: 2018-10-05 — End: 2018-11-04

## 2018-10-06 NOTE — Unmapped (Signed)
Try clarithromycin 500 mg bid for 1 week and see if this is more succesful then doxycycline. You can try monotherapy or in combination with low dose flagyl.    We will add Vit D to next lab draw.

## 2018-10-10 NOTE — Unmapped (Signed)
Called pt regarding COVID screening questions, up to date advance care plan, and new policies in place for [facility]. No answer, left message for patient to call back.

## 2018-10-10 NOTE — Unmapped (Signed)
Pt called back regarding COVID screening questions. Pt denies any recent travels, resp s/s, fever, loss of taste/smell, sore throat, muscle pain, N/V, diarrhea, exposure, or any ED/urgent care visits. Health care decision maker verified and up to date. Pt instructed on the recent no visitor policy in place. Pt also instructed to wear mask from home if accessible. Pt verbalized understanding.

## 2018-10-11 ENCOUNTER — Encounter: Admit: 2018-10-11 | Discharge: 2018-10-12 | Payer: PRIVATE HEALTH INSURANCE

## 2018-10-11 DIAGNOSIS — K529 Noninfective gastroenteritis and colitis, unspecified: Secondary | ICD-10-CM

## 2018-10-11 DIAGNOSIS — K5100 Ulcerative (chronic) pancolitis without complications: Principal | ICD-10-CM

## 2018-10-11 LAB — C-REACTIVE PROTEIN: C reactive protein:MCnc:Pt:Ser/Plas:Qn:: <5

## 2018-10-11 LAB — CBC W/ AUTO DIFF
BASOPHILS ABSOLUTE COUNT: 0.1 10*9/L (ref 0.0–0.1)
BASOPHILS RELATIVE PERCENT: 1.7 %
EOSINOPHILS RELATIVE PERCENT: 3 %
HEMATOCRIT: 44.5 % (ref 41.0–53.0)
HEMOGLOBIN: 14.5 g/dL (ref 13.5–17.5)
LARGE UNSTAINED CELLS: 3 % (ref 0–4)
LYMPHOCYTES ABSOLUTE COUNT: 1.3 10*9/L — ABNORMAL LOW (ref 1.5–5.0)
LYMPHOCYTES RELATIVE PERCENT: 32.7 %
MEAN CORPUSCULAR HEMOGLOBIN CONC: 32.6 g/dL (ref 31.0–37.0)
MEAN CORPUSCULAR HEMOGLOBIN: 32.4 pg (ref 26.0–34.0)
MEAN CORPUSCULAR VOLUME: 99.3 fL (ref 80.0–100.0)
MEAN PLATELET VOLUME: 7.5 fL (ref 7.0–10.0)
MONOCYTES ABSOLUTE COUNT: 0.2 10*9/L (ref 0.2–0.8)
MONOCYTES RELATIVE PERCENT: 5.9 %
NEUTROPHILS RELATIVE PERCENT: 54.2 %
PLATELET COUNT: 307 10*9/L (ref 150–440)
RED BLOOD CELL COUNT: 4.48 10*12/L — ABNORMAL LOW (ref 4.50–5.90)
RED CELL DISTRIBUTION WIDTH: 13.1 % (ref 12.0–15.0)
WBC ADJUSTED: 4.1 10*9/L — ABNORMAL LOW (ref 4.5–11.0)

## 2018-10-11 LAB — ALT (SGPT): Alanine aminotransferase:CCnc:Pt:Ser/Plas:Qn:: 38

## 2018-10-11 LAB — BASOPHILS RELATIVE PERCENT: Lab: 1.7

## 2018-10-11 LAB — VITAMIN D, TOTAL (25OH): Lab: 87.4 — ABNORMAL HIGH

## 2018-10-11 LAB — ALKALINE PHOSPHATASE: Alkaline phosphatase:CCnc:Pt:Ser/Plas:Qn:: 48

## 2018-10-11 LAB — AST (SGOT): Aspartate aminotransferase:CCnc:Pt:Ser/Plas:Qn:: 34

## 2018-10-11 NOTE — Unmapped (Signed)
1035 Patient in today for Entyvio infusion. Patient has no s/s ??of infection.This patient first Entyvio infusion but was on Remicade and Humira before. Printed information about Entyvio provided. Patient verbalized understanding.  1106 Entyvio started ,patient instructed to use call bell /call nurse for any s/s of unsual symptoms during infusion. Patient educated on possible s/s of reaction,such as chestpain ,itching,shortness of breath lightheadedness and any kind of discomfort. Patient verbalized understanding.  1136 Infusion completed. No s/s of adverse reaction.  1145 Entyvio 300 mg/250 NS infused over 30 min. per protocol . Pt alert and oriented, offered no complaints during infusion. IV flushed with 10ml NS post infusion. Pt tolerated infusion well.Discharged instructions given .Numbers to call for questions provided

## 2018-11-04 IMAGING — MR MR [PERSON_NAME] ABD W/CM
5 of 15 series · 11 of 48 positions shown · IV contrast (20 ML MULTIHANCE)
Comparison: 05/20/2017

CLINICAL DATA: Chronic diarrhea for 6 years with suspicion for
inflammatory bowel disease, prior colonoscopies at [HOSPITAL] suggest
ulcerative pain colitis. Chronic enteritis at the terminal ileum
without active enteritis back in 3183.

EXAM:
MR ABDOMEN AND PELVIS WITHOUT AND WITH CONTRAST (MR ENTEROGRAPHY)
TECHNIQUE: Multiplanar, multisequence MRI of the abdomen and pelvis was
performed both before and during bolus administration of intravenous
contrast. Negative oral contrast VoLumen was given.
CONTRAST:  20mL MULTIHANCE GADOBENATE DIMEGLUMINE 529 MG/ML IV SOLN

[Series 2: T2 · coronal · 6.0mm · 1.72mm/px · 1 of 37 slices shown (1 of 2)]
[im 1/37]
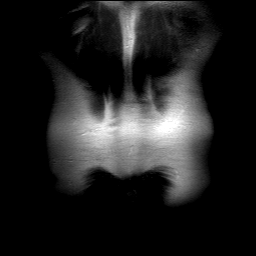

[Series 3: T2 · axial · 6.5mm · 1.48mm/px · 1 of 57 slices shown (2 of 2)]
[im 1/57]
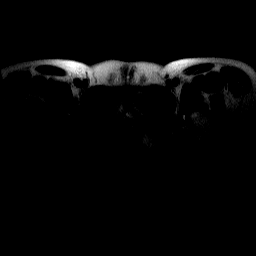

[Series 4: cor true fisp · coronal · 4.0mm · 0.86mm/px · 1 of 40 slices shown]
[im 1/40]
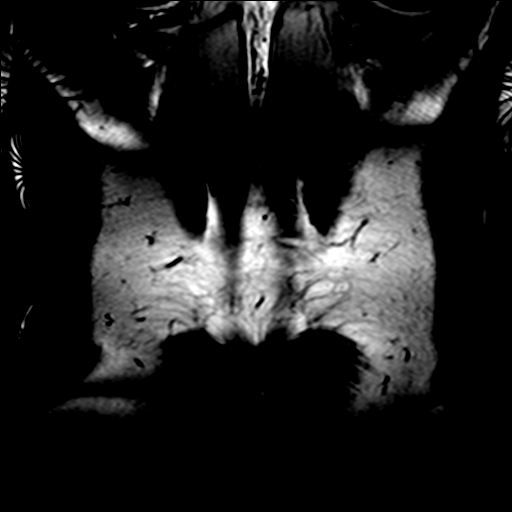

[Series 5: DWI · axial · 6.5mm · 2.97mm/px · z∈[-212,+229]mm · 6 of 176 slices shown]
[im 1/176]
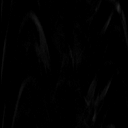
[im 36/176]
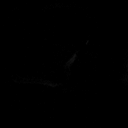
[im 71/176]
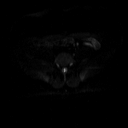
[im 106/176]
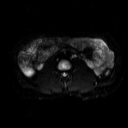
[im 141/176]
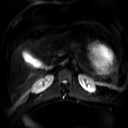
[im 176/176]
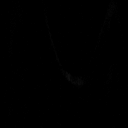

[Series 6: axial dwi_adc · axial · 6.5mm · 2.97mm/px · z∈[-205,+221]mm · 2 of 53 slices shown]
[im 1/53]
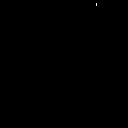
[im 53/53]
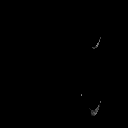

[11 of 48 positions shown; findings below may reference images not displayed]

FINDINGS: Despite efforts by the technologist and patient, motion artifact is
present on today's exam and could not be eliminated. This reduces
exam sensitivity and specificity.

COMBINED FINDINGS FOR BOTH MR ABDOMEN AND PELVIS

Lower chest: Unremarkable where included.

Hepatobiliary: Unremarkable. Gallbladder appears normal. No biliary
dilatation or findings of cholangitis.

Pancreas:  Unremarkable

Spleen: Very small spleen or splenic remnant measuring 5.3 by 1.4 by
3.0 cm (volume = 12 cm^3).

Adrenals/Urinary Tract:  Unremarkable

Stomach/Bowel: The stomach in duodenum appear unremarkable.

Abnormal accentuated mucosal enhancement and mild wall thickening
throughout the colon, with mucosal enhancement especially notable in
the distal descending colon, sigmoid colon, and rectum. Air- fluid
levels in the descending colon noted compatible with diarrheal
process. Wall thickening in the hepatic flexure is well shown on
image 26/15.

Terminal ileal involvement is not appreciated as a significant
feature on today's exam. Small bowel caliber normal. Jejunal folds
appear unremarkable. No appreciable skip lesions. No perianal or
ischiorectal fossa abnormality identified.

Vascular/Lymphatic:  Unremarkable

Reproductive: Unremarkable

Other:  No supplemental non-categorized findings.

Musculoskeletal: Unremarkable
IMPRESSION: 1. Diffuse colonic mucosal enhancement with mild to moderate degrees
of wall thickening extending from the rectum to the cecum, without
definite small bowel involvement an without visualized skip lesions,
creeping fat, abscess, or specific perianal complicating features.
These characteristics in the context of the patient's history favor
ulcerative colitis.
2. Very diminutive spleen only measuring 12 cubic cm, without signs
of an acute splenic abnormality. The splenic vein appears patent on
today's exam. Cause for the diminutive spleen is uncertain.

## 2018-11-08 ENCOUNTER — Encounter: Admit: 2018-11-08 | Discharge: 2018-11-09 | Payer: PRIVATE HEALTH INSURANCE

## 2018-11-08 DIAGNOSIS — K529 Noninfective gastroenteritis and colitis, unspecified: Secondary | ICD-10-CM

## 2018-11-08 DIAGNOSIS — K5100 Ulcerative (chronic) pancolitis without complications: Principal | ICD-10-CM

## 2018-12-06 ENCOUNTER — Encounter: Admit: 2018-12-06 | Discharge: 2018-12-07 | Payer: PRIVATE HEALTH INSURANCE

## 2018-12-06 DIAGNOSIS — K5100 Ulcerative (chronic) pancolitis without complications: Principal | ICD-10-CM

## 2018-12-06 DIAGNOSIS — K529 Noninfective gastroenteritis and colitis, unspecified: Secondary | ICD-10-CM

## 2018-12-07 ENCOUNTER — Encounter
Admit: 2018-12-07 | Discharge: 2018-12-08 | Payer: PRIVATE HEALTH INSURANCE | Attending: Gastroenterology | Primary: Gastroenterology

## 2018-12-07 DIAGNOSIS — K5100 Ulcerative (chronic) pancolitis without complications: Principal | ICD-10-CM

## 2018-12-07 DIAGNOSIS — Z79899 Other long term (current) drug therapy: Secondary | ICD-10-CM

## 2018-12-07 MED ORDER — TINIDAZOLE 250 MG TABLET
ORAL_TABLET | Freq: Two times a day (BID) | ORAL | 0 refills | 0.00000 days | Status: CP
Start: 2018-12-07 — End: 2019-01-25

## 2018-12-07 MED ORDER — CLARITHROMYCIN 500 MG TABLET
ORAL_TABLET | Freq: Two times a day (BID) | ORAL | 2 refills | 0.00000 days | Status: CP
Start: 2018-12-07 — End: 2019-03-07

## 2019-01-03 ENCOUNTER — Encounter: Admit: 2019-01-03 | Discharge: 2019-01-04 | Payer: PRIVATE HEALTH INSURANCE

## 2019-01-03 DIAGNOSIS — K529 Noninfective gastroenteritis and colitis, unspecified: Secondary | ICD-10-CM

## 2019-01-03 DIAGNOSIS — K5100 Ulcerative (chronic) pancolitis without complications: Principal | ICD-10-CM

## 2019-01-04 MED ORDER — FOLIC ACID 1 MG TABLET
ORAL_TABLET | Freq: Every day | ORAL | 0 refills | 90.00000 days | Status: CP
Start: 2019-01-04 — End: 2019-04-04

## 2019-01-25 MED ORDER — TINIDAZOLE 250 MG TABLET
ORAL_TABLET | Freq: Two times a day (BID) | ORAL | 0 refills | 30.00000 days | Status: CP
Start: 2019-01-25 — End: 2019-02-24

## 2019-01-31 ENCOUNTER — Encounter: Admit: 2019-01-31 | Discharge: 2019-01-31 | Payer: PRIVATE HEALTH INSURANCE

## 2019-01-31 DIAGNOSIS — K529 Noninfective gastroenteritis and colitis, unspecified: Secondary | ICD-10-CM

## 2019-01-31 DIAGNOSIS — K5100 Ulcerative (chronic) pancolitis without complications: Principal | ICD-10-CM

## 2019-02-14 MED ORDER — TINIDAZOLE 250 MG TABLET
ORAL_TABLET | Freq: Every day | ORAL | 1 refills | 30 days | Status: CP
Start: 2019-02-14 — End: 2019-02-16

## 2019-02-16 MED ORDER — TINIDAZOLE 250 MG TABLET
ORAL_TABLET | Freq: Every day | ORAL | 1 refills | 90 days | Status: CP
Start: 2019-02-16 — End: 2019-05-17

## 2019-02-28 ENCOUNTER — Ambulatory Visit: Admit: 2019-02-28 | Discharge: 2019-03-01 | Payer: PRIVATE HEALTH INSURANCE

## 2019-02-28 DIAGNOSIS — K5100 Ulcerative (chronic) pancolitis without complications: Secondary | ICD-10-CM

## 2019-02-28 DIAGNOSIS — K529 Noninfective gastroenteritis and colitis, unspecified: Secondary | ICD-10-CM

## 2019-03-28 ENCOUNTER — Encounter: Admit: 2019-03-28 | Discharge: 2019-03-28 | Payer: PRIVATE HEALTH INSURANCE

## 2019-03-28 DIAGNOSIS — K5100 Ulcerative (chronic) pancolitis without complications: Principal | ICD-10-CM

## 2019-03-28 DIAGNOSIS — K529 Noninfective gastroenteritis and colitis, unspecified: Principal | ICD-10-CM

## 2019-03-29 ENCOUNTER — Encounter
Admit: 2019-03-29 | Discharge: 2019-03-30 | Payer: PRIVATE HEALTH INSURANCE | Attending: Gastroenterology | Primary: Gastroenterology

## 2019-03-29 MED ORDER — BUDESONIDE DR-ER 9 MG TABLET,DELAYED AND EXTENDED RELEASE: 9 mg | tablet | Freq: Every day | 0 refills | 60 days | Status: AC

## 2019-04-25 ENCOUNTER — Encounter: Admit: 2019-04-25 | Discharge: 2019-04-26 | Payer: PRIVATE HEALTH INSURANCE

## 2019-04-25 DIAGNOSIS — K5100 Ulcerative (chronic) pancolitis without complications: Principal | ICD-10-CM

## 2019-04-25 DIAGNOSIS — K529 Noninfective gastroenteritis and colitis, unspecified: Principal | ICD-10-CM

## 2019-04-28 ENCOUNTER — Ambulatory Visit: Admit: 2019-04-28 | Discharge: 2019-04-29 | Attending: Nurse Practitioner | Primary: Nurse Practitioner

## 2019-05-01 ENCOUNTER — Encounter
Admit: 2019-05-01 | Discharge: 2019-05-01 | Payer: PRIVATE HEALTH INSURANCE | Attending: Anesthesiology | Primary: Anesthesiology

## 2019-05-01 ENCOUNTER — Encounter: Admit: 2019-05-01 | Discharge: 2019-05-01 | Payer: PRIVATE HEALTH INSURANCE

## 2019-05-23 ENCOUNTER — Ambulatory Visit: Admit: 2019-05-23 | Discharge: 2019-05-24 | Payer: PRIVATE HEALTH INSURANCE

## 2019-05-30 DIAGNOSIS — Z79899 Other long term (current) drug therapy: Principal | ICD-10-CM

## 2019-05-30 DIAGNOSIS — K5100 Ulcerative (chronic) pancolitis without complications: Principal | ICD-10-CM

## 2019-05-30 MED ORDER — CLARITHROMYCIN 500 MG TABLET
ORAL_TABLET | Freq: Two times a day (BID) | ORAL | 1 refills | 90 days | Status: CP
Start: 2019-05-30 — End: 2019-08-28

## 2019-05-30 MED ORDER — TINIDAZOLE 250 MG TABLET
ORAL_TABLET | Freq: Every day | ORAL | 1 refills | 90 days | Status: CP
Start: 2019-05-30 — End: 2019-08-28

## 2019-05-30 MED ORDER — HYOSCYAMINE SULFATE 0.125 MG TABLET
ORAL_TABLET | ORAL | 0 refills | 5 days | Status: CP | PRN
Start: 2019-05-30 — End: 2019-06-09

## 2019-06-20 ENCOUNTER — Encounter: Admit: 2019-06-20 | Discharge: 2019-06-21 | Payer: PRIVATE HEALTH INSURANCE

## 2019-07-18 ENCOUNTER — Ambulatory Visit: Admit: 2019-07-18 | Discharge: 2019-07-19 | Payer: PRIVATE HEALTH INSURANCE

## 2019-07-18 DIAGNOSIS — R945 Abnormal results of liver function studies: Principal | ICD-10-CM

## 2019-07-19 ENCOUNTER — Telehealth
Admit: 2019-07-19 | Discharge: 2019-07-20 | Payer: PRIVATE HEALTH INSURANCE | Attending: Gastroenterology | Primary: Gastroenterology

## 2019-07-19 DIAGNOSIS — K5100 Ulcerative (chronic) pancolitis without complications: Principal | ICD-10-CM

## 2019-07-19 MED ORDER — VANCOMYCIN 125 MG CAPSULE
ORAL_CAPSULE | Freq: Four times a day (QID) | ORAL | 0 refills | 14.00000 days | Status: CP
Start: 2019-07-19 — End: 2019-08-02

## 2019-08-15 ENCOUNTER — Encounter: Admit: 2019-08-15 | Discharge: 2019-08-16 | Payer: PRIVATE HEALTH INSURANCE

## 2019-09-12 ENCOUNTER — Encounter: Admit: 2019-09-12 | Discharge: 2019-09-13 | Payer: PRIVATE HEALTH INSURANCE

## 2019-10-10 ENCOUNTER — Encounter: Admit: 2019-10-10 | Discharge: 2019-10-11 | Payer: PRIVATE HEALTH INSURANCE

## 2019-11-07 ENCOUNTER — Ambulatory Visit: Admit: 2019-11-07 | Discharge: 2019-11-07 | Payer: PRIVATE HEALTH INSURANCE

## 2019-11-07 DIAGNOSIS — K529 Noninfective gastroenteritis and colitis, unspecified: Principal | ICD-10-CM

## 2019-11-07 DIAGNOSIS — K5100 Ulcerative (chronic) pancolitis without complications: Principal | ICD-10-CM

## 2019-12-06 ENCOUNTER — Encounter: Admit: 2019-12-06 | Discharge: 2019-12-07 | Payer: PRIVATE HEALTH INSURANCE

## 2020-01-03 ENCOUNTER — Encounter: Admit: 2020-01-03 | Discharge: 2020-01-04 | Payer: PRIVATE HEALTH INSURANCE

## 2020-01-30 ENCOUNTER — Encounter: Admit: 2020-01-30 | Discharge: 2020-01-31 | Payer: PRIVATE HEALTH INSURANCE

## 2020-02-12 ENCOUNTER — Encounter: Payer: Self-pay | Admitting: Family Medicine

## 2020-02-12 ENCOUNTER — Other Ambulatory Visit: Payer: Self-pay

## 2020-02-12 ENCOUNTER — Ambulatory Visit (INDEPENDENT_AMBULATORY_CARE_PROVIDER_SITE_OTHER): Payer: BLUE CROSS/BLUE SHIELD | Admitting: Family Medicine

## 2020-02-12 VITALS — BP 101/62 | HR 54 | Ht 74.5 in | Wt 208.0 lb

## 2020-02-12 DIAGNOSIS — K529 Noninfective gastroenteritis and colitis, unspecified: Secondary | ICD-10-CM | POA: Diagnosis not present

## 2020-02-12 DIAGNOSIS — R202 Paresthesia of skin: Secondary | ICD-10-CM

## 2020-02-12 DIAGNOSIS — R109 Unspecified abdominal pain: Secondary | ICD-10-CM

## 2020-02-12 DIAGNOSIS — G8929 Other chronic pain: Secondary | ICD-10-CM

## 2020-02-12 NOTE — Progress Notes (Signed)
Office Visit Note   Patient: Jorge Clark           Date of Birth: [birth year]           MRN: [id number] Visit Date: 02/12/2020 Requested by: Orlena Sheldon, MD [address] [place],  [place] [address] PCP: Orlena Sheldon, MD  Subjective: Chief Complaint  Patient presents with  . functional medicine consult for ulcerative colitis    HPI: He is here with abdominal pain and diarrhea.  He has a complicated medical history, but he summarized it nicely on a timeline.  In 2012 after graduating from college he went to [place].  He lived there for about 6 months.  He then started having frequent diarrhea.  He went to a doctor there who presumed he had Giardia and treated him with metronidazole.  His symptoms improved significantly but then started to recur after the medicines were stopped.  He decided to move back to [place] and saw his GP, who placed him on 2 more rounds of Flagyl.  The symptoms improved at first, but kept coming back.  Ultimately in 2013 he was evaluated by a gastroenterologist and had colonoscopy done which showed ulcerative colitis.  He was placed on high-dose prednisone, mesalamine and other similar drugs and at one point he was taking more than 30 pills/day.  Ultimately he was started on Remicade and then he moved to [facility] and saw Dr. Coralyn Mark who diagnosed him with indeterminate Crohn's and ulcerative colitis.  He was placed on higher frequency Remicade, high-dose prednisone, and Flagyl was resumed.  Slowly his symptoms started to improve and eventually he was tapered off prednisone and Flagyl.  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  Unfortunately he developed antibodies to Remicade and his symptoms came back.  He was placed on Flagyl again and folic acid along with Humira.  He had to quit his coaching job because of his troubles.  He was eventually placed on increased dose of Humira and his symptoms improved, he was taken off Flagyl.  In 2015 he  went back to [place] to coach, symptoms were manageable at that point but in 2016 his symptoms returned.  He was placed on Flagyl again and his symptoms improved.  In 2017 symptoms came back, he was placed on prednisone again as well as Stelara, Humira, prednisone and Flagyl.  He was told at that point he needed a colectomy.  He was not mentally ready for that, so he did some research on his own and began AIP diet.  Colectomy was canceled, he improved from a symptom standpoint.  Later he tested positive for C. difficile and was placed on oral vancomycin.  Symptoms improved and vancomycin was stopped.  Symptoms came back pretty quickly.  He did a second round of vancomycin and ultimately was treated with Flagyl again.  He tested variety of diets.  He moved back to their own and began a low FODMAP diet along with probiotics.  Since 2018 he has had fluctuating symptoms, he always seems to be better when he is on some sort of antibiotic.  He is contemplating fecal transplant, and is going to meet with another gastroenterologist for this.  Lately he has been having some paresthesias in his lower extremities and occasionally in his upper extremities and he wanted to be sure he was not developing neuropathy from Flagyl.  Family history of Crohn's disease disease in his father.  ROS:   All other systems were reviewed and are negative.  Objective: Vital Signs: BP 101/62   Pulse (!) 54   Ht 6' 2.5" (1.892 m)   Wt 208 lb (94.3 kg)   BMI 26.35 kg/m   Physical Exam:  General:  Alert and oriented, in no acute distress. Pulm:  Breathing unlabored. Psy:  Normal mood, congruent affect. Skin: No visible abnormalities.   Imaging: No results found.  Assessment & Plan: 1.  Chronic abdominal troubles with inflammatory bowel disease, triggered while living in [place].  Question whether he had underlying infectious source as his trigger. -We will draw some labs to further evaluate. -I will also  consider ordering [facility] stool tests if patient desires.     Procedures: No procedures performed  No notes on file     PMFS History: Patient Active Problem List   Diagnosis Date Noted  . Colitis, indeterminate 05/04/2017  . High risk medication use 05/04/2017   Past Medical History:  Diagnosis Date  . Crohn's colitis ([place])     History reviewed. No pertinent family history.  Past Surgical History:  Procedure Laterality Date  . COLONOSCOPY WITH PROPOFOL N/A 05/23/2017   Procedure: COLONOSCOPY WITH PROPOFOL;  Surgeon: Jonathon Bellows, MD;  Location: [facility] ENDOSCOPY;  Service: Gastroenterology;  Laterality: N/A;   Social History   Occupational History  . Not on file  Tobacco Use  . Smoking status: Never Smoker  . Smokeless tobacco: Never Used  Vaping Use  . Vaping Use: Never used  Substance and Sexual Activity  . Alcohol use: No  . Drug use: No  . Sexual activity: Yes

## 2020-02-14 ENCOUNTER — Telehealth: Payer: Self-pay | Admitting: Family Medicine

## 2020-02-14 LAB — ANTI-NUCLEAR AB-TITER (ANA TITER): ANA Titer 1: 1:160 {titer} — ABNORMAL HIGH

## 2020-02-14 LAB — THYROID PANEL WITH TSH
Free Thyroxine Index: 2 (ref 1.4–3.8)
T3 Uptake: 27 % (ref 22–35)
T4, Total: 7.4 ug/dL (ref 4.9–10.5)
TSH: 1.66 mIU/L (ref 0.40–4.50)

## 2020-02-14 LAB — C-REACTIVE PROTEIN: CRP: 7.3 mg/L (ref <8.0)

## 2020-02-14 LAB — VITAMIN B12: Vitamin B-12: 568 pg/mL (ref 200–1100)

## 2020-02-14 LAB — SEDIMENTATION RATE: Sed Rate: 9 mm/h (ref 0–15)

## 2020-02-14 LAB — ANA: Anti Nuclear Antibody (ANA): POSITIVE — AB

## 2020-02-14 LAB — ZINC: Zinc: 57 ug/dL — ABNORMAL LOW (ref 60–130)

## 2020-02-14 LAB — IRON,TIBC AND FERRITIN PANEL
%SAT: 49 % (calc) — ABNORMAL HIGH (ref 20–48)
Ferritin: 92 ng/mL (ref 38–380)
Iron: 131 ug/dL (ref 50–180)
TIBC: 268 mcg/dL (calc) (ref 250–425)

## 2020-02-14 LAB — VITAMIN D 25 HYDROXY (VIT D DEFICIENCY, FRACTURES): Vit D, 25-Hydroxy: 44 ng/mL (ref 30–100)

## 2020-02-14 LAB — THYROID PEROXIDASE ANTIBODY: Thyroperoxidase Ab SerPl-aCnc: 10 IU/mL — ABNORMAL HIGH (ref <9)

## 2020-02-14 NOTE — Telephone Encounter (Signed)
Labs show:  ANA is positive with a moderately high titer.  CRP and sed rate are normal.  Iron studies look good.  B12 is normal.  Thyroid studies look good, but thyroid antibodies are positive (suggesting Hashimoto's thyroiditis).  Vitamin D looks good.  Zinc is low (this is important for gut healing).  I recommend taking 20-30 mg daily.

## 2020-02-18 DIAGNOSIS — K5100 Ulcerative (chronic) pancolitis without complications: Principal | ICD-10-CM

## 2020-02-18 DIAGNOSIS — A0472 Enterocolitis due to Clostridium difficile, not specified as recurrent: Principal | ICD-10-CM

## 2020-02-18 MED ORDER — VANCOMYCIN 125 MG CAPSULE
ORAL_CAPSULE | Freq: Two times a day (BID) | ORAL | 3 refills | 90.00000 days | Status: CP
Start: 2020-02-18 — End: 2021-02-17

## 2020-02-20 ENCOUNTER — Telehealth
Admit: 2020-02-20 | Discharge: 2020-02-21 | Payer: PRIVATE HEALTH INSURANCE | Attending: Gastroenterology | Primary: Gastroenterology

## 2020-02-28 ENCOUNTER — Encounter: Admit: 2020-02-28 | Discharge: 2020-02-29 | Payer: PRIVATE HEALTH INSURANCE

## 2020-02-29 DIAGNOSIS — A0472 Enterocolitis due to Clostridium difficile, not specified as recurrent: Principal | ICD-10-CM

## 2020-02-29 MED ORDER — VANCOMYCIN 125 MG CAPSULE
ORAL_CAPSULE | Freq: Two times a day (BID) | ORAL | 0 refills | 30 days
Start: 2020-02-29 — End: 2020-02-29

## 2020-02-29 MED ORDER — VANCOMYCIN 125 MG/2.5 ML ORAL SYRINGE  (FOR ORAL USE ONLY)
Freq: Two times a day (BID) | ORAL | 6 refills | 0 days | Status: CP
Start: 2020-02-29 — End: 2020-03-30

## 2020-03-01 DIAGNOSIS — A0472 Enterocolitis due to Clostridium difficile, not specified as recurrent: Principal | ICD-10-CM

## 2020-03-01 MED ORDER — VANCOMYCIN 125 MG CAPSULE
ORAL_CAPSULE | Freq: Two times a day (BID) | ORAL | 11 refills | 30.00000 days | Status: CP
Start: 2020-03-01 — End: 2021-03-01

## 2020-03-25 ENCOUNTER — Encounter: Admit: 2020-03-25 | Discharge: 2020-03-26 | Payer: PRIVATE HEALTH INSURANCE

## 2020-04-01 ENCOUNTER — Telehealth: Payer: Self-pay | Admitting: Family Medicine

## 2020-04-01 NOTE — Telephone Encounter (Signed)
It appears that there is an overgrowth of Citrobacter and proteus, based on [facility] results.

## 2020-04-23 ENCOUNTER — Encounter: Admit: 2020-04-23 | Discharge: 2020-04-24 | Payer: PRIVATE HEALTH INSURANCE

## 2020-04-23 DIAGNOSIS — K5100 Ulcerative (chronic) pancolitis without complications: Principal | ICD-10-CM

## 2020-04-23 DIAGNOSIS — K529 Noninfective gastroenteritis and colitis, unspecified: Principal | ICD-10-CM

## 2020-05-23 ENCOUNTER — Encounter: Admit: 2020-05-23 | Discharge: 2020-05-24 | Payer: PRIVATE HEALTH INSURANCE

## 2020-05-23 DIAGNOSIS — K5100 Ulcerative (chronic) pancolitis without complications: Principal | ICD-10-CM

## 2020-05-23 DIAGNOSIS — K529 Noninfective gastroenteritis and colitis, unspecified: Principal | ICD-10-CM

## 2020-06-18 ENCOUNTER — Encounter: Admit: 2020-06-18 | Discharge: 2020-06-19 | Payer: PRIVATE HEALTH INSURANCE

## 2020-06-18 DIAGNOSIS — K5100 Ulcerative (chronic) pancolitis without complications: Principal | ICD-10-CM

## 2020-06-18 DIAGNOSIS — K529 Noninfective gastroenteritis and colitis, unspecified: Principal | ICD-10-CM

## 2020-07-18 ENCOUNTER — Ambulatory Visit: Admit: 2020-07-18 | Discharge: 2020-07-19 | Payer: PRIVATE HEALTH INSURANCE

## 2020-07-18 DIAGNOSIS — K529 Noninfective gastroenteritis and colitis, unspecified: Principal | ICD-10-CM

## 2020-07-18 DIAGNOSIS — K5100 Ulcerative (chronic) pancolitis without complications: Principal | ICD-10-CM

## 2020-08-15 ENCOUNTER — Ambulatory Visit: Admit: 2020-08-15 | Discharge: 2020-08-16 | Payer: PRIVATE HEALTH INSURANCE

## 2020-09-12 ENCOUNTER — Encounter: Admit: 2020-09-12 | Discharge: 2020-09-13 | Payer: PRIVATE HEALTH INSURANCE

## 2020-10-10 ENCOUNTER — Encounter: Admit: 2020-10-10 | Discharge: 2020-10-11 | Payer: PRIVATE HEALTH INSURANCE

## 2020-11-07 ENCOUNTER — Ambulatory Visit: Admit: 2020-11-07 | Discharge: 2020-11-08 | Payer: PRIVATE HEALTH INSURANCE

## 2020-12-05 ENCOUNTER — Ambulatory Visit: Admit: 2020-12-05 | Discharge: 2020-12-06 | Payer: PRIVATE HEALTH INSURANCE

## 2020-12-29 MED ORDER — PEG 3350-ELECTROLYTES 236 GRAM-22.74 GRAM-6.74 GRAM-5.86 GRAM SOLUTION
Freq: Once | ORAL | 0 refills | 1 days | Status: CP
Start: 2020-12-29 — End: 2020-12-29

## 2021-01-02 ENCOUNTER — Ambulatory Visit: Admit: 2021-01-02 | Discharge: 2021-01-03 | Payer: PRIVATE HEALTH INSURANCE

## 2021-01-02 DIAGNOSIS — K529 Noninfective gastroenteritis and colitis, unspecified: Principal | ICD-10-CM

## 2021-01-02 DIAGNOSIS — K5100 Ulcerative (chronic) pancolitis without complications: Principal | ICD-10-CM

## 2021-01-16 DIAGNOSIS — R253 Fasciculation: Principal | ICD-10-CM

## 2021-01-30 ENCOUNTER — Ambulatory Visit: Admit: 2021-01-30 | Discharge: 2021-01-31 | Payer: PRIVATE HEALTH INSURANCE

## 2021-01-30 DIAGNOSIS — K5100 Ulcerative (chronic) pancolitis without complications: Principal | ICD-10-CM

## 2021-01-30 DIAGNOSIS — K529 Noninfective gastroenteritis and colitis, unspecified: Principal | ICD-10-CM

## 2021-02-26 ENCOUNTER — Ambulatory Visit: Admit: 2021-02-26 | Discharge: 2021-02-26 | Payer: PRIVATE HEALTH INSURANCE

## 2021-02-26 DIAGNOSIS — K529 Noninfective gastroenteritis and colitis, unspecified: Principal | ICD-10-CM

## 2021-02-26 DIAGNOSIS — K5100 Ulcerative (chronic) pancolitis without complications: Principal | ICD-10-CM

## 2021-03-26 ENCOUNTER — Ambulatory Visit: Admit: 2021-03-26 | Discharge: 2021-03-27 | Payer: PRIVATE HEALTH INSURANCE

## 2021-03-26 DIAGNOSIS — A0472 Enterocolitis due to Clostridium difficile, not specified as recurrent: Principal | ICD-10-CM

## 2021-03-26 DIAGNOSIS — K529 Noninfective gastroenteritis and colitis, unspecified: Principal | ICD-10-CM

## 2021-03-26 DIAGNOSIS — K5100 Ulcerative (chronic) pancolitis without complications: Principal | ICD-10-CM

## 2021-03-26 MED ORDER — VANCOMYCIN 125 MG CAPSULE
ORAL_CAPSULE | Freq: Two times a day (BID) | ORAL | 11 refills | 30 days | Status: CP
Start: 2021-03-26 — End: 2022-03-26

## 2021-04-03 MED FILL — PEG 3350-ELECTROLYTES 236 GRAM-22.74 GRAM-6.74 GRAM-5.86 GRAM SOLUTION: ORAL | 1 days supply | Qty: 4000 | Fill #0

## 2021-04-22 ENCOUNTER — Ambulatory Visit: Admit: 2021-04-22 | Discharge: 2021-04-23 | Payer: PRIVATE HEALTH INSURANCE

## 2021-04-22 DIAGNOSIS — K529 Noninfective gastroenteritis and colitis, unspecified: Principal | ICD-10-CM

## 2021-04-22 DIAGNOSIS — K5100 Ulcerative (chronic) pancolitis without complications: Principal | ICD-10-CM

## 2021-04-23 ENCOUNTER — Ambulatory Visit: Admit: 2021-04-23 | Discharge: 2021-04-24 | Payer: PRIVATE HEALTH INSURANCE

## 2021-04-24 ENCOUNTER — Ambulatory Visit: Admit: 2021-04-24 | Discharge: 2021-04-24 | Payer: PRIVATE HEALTH INSURANCE

## 2021-04-24 ENCOUNTER — Encounter: Admit: 2021-04-24 | Discharge: 2021-04-24 | Payer: PRIVATE HEALTH INSURANCE

## 2021-04-29 ENCOUNTER — Telehealth
Admit: 2021-04-29 | Discharge: 2021-04-30 | Payer: PRIVATE HEALTH INSURANCE | Attending: Gastroenterology | Primary: Gastroenterology

## 2021-04-29 DIAGNOSIS — D84821 Immunosuppression due to drug therapy (CMS-HCC): Principal | ICD-10-CM

## 2021-04-29 DIAGNOSIS — K5100 Ulcerative (chronic) pancolitis without complications: Principal | ICD-10-CM

## 2021-04-29 DIAGNOSIS — Z79899 Other long term (current) drug therapy: Principal | ICD-10-CM

## 2021-07-06 DIAGNOSIS — K5100 Ulcerative (chronic) pancolitis without complications: Principal | ICD-10-CM

## 2021-07-10 DIAGNOSIS — K5100 Ulcerative (chronic) pancolitis without complications: Principal | ICD-10-CM

## 2021-07-16 ENCOUNTER — Ambulatory Visit: Admit: 2021-07-16 | Discharge: 2021-07-17 | Payer: PRIVATE HEALTH INSURANCE

## 2021-08-08 DIAGNOSIS — K5100 Ulcerative (chronic) pancolitis without complications: Principal | ICD-10-CM

## 2021-08-11 DIAGNOSIS — E559 Vitamin D deficiency, unspecified: Principal | ICD-10-CM

## 2021-08-13 ENCOUNTER — Ambulatory Visit: Admit: 2021-08-13 | Discharge: 2021-08-14 | Payer: PRIVATE HEALTH INSURANCE

## 2021-08-19 ENCOUNTER — Telehealth
Admit: 2021-08-19 | Discharge: 2021-08-20 | Payer: PRIVATE HEALTH INSURANCE | Attending: Gastroenterology | Primary: Gastroenterology

## 2021-08-19 DIAGNOSIS — A0472 Enterocolitis due to Clostridium difficile, not specified as recurrent: Principal | ICD-10-CM

## 2021-08-19 MED ORDER — VANCOMYCIN 125 MG CAPSULE
ORAL_CAPSULE | Freq: Two times a day (BID) | ORAL | 11 refills | 30.00000 days | Status: CP
Start: 2021-08-19 — End: 2021-08-19

## 2021-09-05 DIAGNOSIS — E559 Vitamin D deficiency, unspecified: Principal | ICD-10-CM

## 2021-09-05 DIAGNOSIS — K5100 Ulcerative (chronic) pancolitis without complications: Principal | ICD-10-CM

## 2021-09-10 ENCOUNTER — Ambulatory Visit: Admit: 2021-09-10 | Discharge: 2021-09-11 | Payer: PRIVATE HEALTH INSURANCE

## 2022-07-07 DIAGNOSIS — A0472 Enterocolitis due to Clostridium difficile, not specified as recurrent: Principal | ICD-10-CM

## 2022-07-07 MED ORDER — VANCOMYCIN 125 MG CAPSULE
ORAL_CAPSULE | Freq: Two times a day (BID) | ORAL | 3 refills | 90 days | Status: CP
Start: 2022-07-07 — End: 2023-07-07

## 2022-08-20 DIAGNOSIS — K529 Noninfective gastroenteritis and colitis, unspecified: Principal | ICD-10-CM

## 2022-08-20 DIAGNOSIS — K5100 Ulcerative (chronic) pancolitis without complications: Principal | ICD-10-CM

## 2022-08-20 MED ORDER — VANCOMYCIN 125 MG CAPSULE
ORAL_CAPSULE | Freq: Two times a day (BID) | ORAL | 0 refills | 5 days | Status: CP
Start: 2022-08-20 — End: ?

## 2022-11-17 ENCOUNTER — Telehealth
Admit: 2022-11-17 | Discharge: 2022-11-18 | Payer: PRIVATE HEALTH INSURANCE | Attending: Gastroenterology | Primary: Gastroenterology

## 2022-11-17 DIAGNOSIS — Z79899 Other long term (current) drug therapy: Principal | ICD-10-CM

## 2022-11-17 DIAGNOSIS — E611 Iron deficiency: Principal | ICD-10-CM

## 2022-11-17 DIAGNOSIS — E559 Vitamin D deficiency, unspecified: Principal | ICD-10-CM

## 2022-11-17 DIAGNOSIS — Z1159 Encounter for screening for other viral diseases: Principal | ICD-10-CM

## 2022-11-17 DIAGNOSIS — K51011 Ulcerative (chronic) pancolitis with rectal bleeding: Principal | ICD-10-CM

## 2022-12-01 DIAGNOSIS — A0472 Enterocolitis due to Clostridium difficile, not specified as recurrent: Principal | ICD-10-CM

## 2022-12-01 MED ORDER — VANCOMYCIN 125 MG CAPSULE
ORAL_CAPSULE | Freq: Two times a day (BID) | ORAL | 3 refills | 90 days | Status: CP
Start: 2022-12-01 — End: 2023-12-01

## 2022-12-28 DIAGNOSIS — K51011 Ulcerative (chronic) pancolitis with rectal bleeding: Principal | ICD-10-CM

## 2022-12-28 MED ORDER — METRONIDAZOLE 500 MG TABLET
ORAL_TABLET | Freq: Three times a day (TID) | ORAL | 0 refills | 30 days | Status: CP
Start: 2022-12-28 — End: 2023-01-27

## 2023-01-28 ENCOUNTER — Encounter: Admit: 2023-01-28 | Discharge: 2023-01-28 | Payer: PRIVATE HEALTH INSURANCE

## 2023-01-28 ENCOUNTER — Ambulatory Visit: Admit: 2023-01-28 | Discharge: 2023-01-28 | Payer: PRIVATE HEALTH INSURANCE

## 2023-02-02 DIAGNOSIS — K51011 Ulcerative (chronic) pancolitis with rectal bleeding: Principal | ICD-10-CM

## 2023-02-02 MED ORDER — RINVOQ 45 MG TABLET,EXTENDED RELEASE
ORAL_TABLET | Freq: Every day | ORAL | 0 refills | 56 days | Status: CP
Start: 2023-02-02 — End: ?

## 2023-02-02 MED ORDER — UPADACITINIB ER 30 MG TABLET,EXTENDED RELEASE 24 HR
ORAL_TABLET | Freq: Every day | ORAL | 0 refills | 90 days | Status: CP
Start: 2023-02-02 — End: ?

## 2023-02-08 DIAGNOSIS — K51011 Ulcerative (chronic) pancolitis with rectal bleeding: Principal | ICD-10-CM

## 2023-02-08 MED ORDER — RINVOQ 45 MG TABLET,EXTENDED RELEASE
ORAL_TABLET | Freq: Every day | ORAL | 0 refills | 56 days | Status: CP
Start: 2023-02-08 — End: ?

## 2023-02-08 MED ORDER — UPADACITINIB ER 30 MG TABLET,EXTENDED RELEASE 24 HR
ORAL_TABLET | Freq: Every day | ORAL | 0 refills | 90 days | Status: CP
Start: 2023-02-08 — End: ?

## 2023-03-14 DIAGNOSIS — K5100 Ulcerative (chronic) pancolitis without complications: Principal | ICD-10-CM

## 2023-03-14 MED ORDER — METRONIDAZOLE 500 MG TABLET
ORAL_TABLET | Freq: Three times a day (TID) | ORAL | 0 refills | 30 days | Status: CP
Start: 2023-03-14 — End: ?

## 2023-04-04 DIAGNOSIS — A0472 Enterocolitis due to Clostridium difficile, not specified as recurrent: Principal | ICD-10-CM

## 2023-04-04 MED ORDER — VANCOMYCIN 125 MG CAPSULE
ORAL_CAPSULE | Freq: Two times a day (BID) | ORAL | 0 refills | 90 days | Status: CP
Start: 2023-04-04 — End: 2024-04-03

## 2023-04-11 ENCOUNTER — Ambulatory Visit: Admit: 2023-04-11 | Discharge: 2023-04-12 | Payer: BLUE CROSS/BLUE SHIELD

## 2023-04-28 ENCOUNTER — Telehealth: Admit: 2023-04-28 | Discharge: 2023-04-29 | Payer: BLUE CROSS/BLUE SHIELD

## 2023-04-28 DIAGNOSIS — K51011 Ulcerative (chronic) pancolitis with rectal bleeding: Principal | ICD-10-CM

## 2023-04-28 MED ORDER — RINVOQ 45 MG TABLET,EXTENDED RELEASE
ORAL_TABLET | Freq: Every day | ORAL | 0 refills | 28 days | Status: CP
Start: 2023-04-28 — End: 2023-05-26

## 2023-07-05 DIAGNOSIS — A0472 Enterocolitis due to Clostridium difficile, not specified as recurrent: Principal | ICD-10-CM

## 2023-07-05 MED ORDER — VANCOMYCIN 125 MG CAPSULE
ORAL_CAPSULE | Freq: Two times a day (BID) | ORAL | 0 refills | 90.00 days | Status: CP
Start: 2023-07-05 — End: 2023-10-03

## 2023-07-14 DIAGNOSIS — K51011 Ulcerative (chronic) pancolitis with rectal bleeding: Principal | ICD-10-CM

## 2023-07-14 MED ORDER — RINVOQ 30 MG TABLET,EXTENDED RELEASE
ORAL_TABLET | 0 refills | 0.00 days | Status: CP
Start: 2023-07-14 — End: ?

## 2023-08-11 ENCOUNTER — Ambulatory Visit
Admit: 2023-08-11 | Discharge: 2023-08-12 | Payer: BLUE CROSS/BLUE SHIELD | Attending: Gastroenterology | Primary: Gastroenterology

## 2023-08-11 DIAGNOSIS — K51011 Ulcerative (chronic) pancolitis with rectal bleeding: Principal | ICD-10-CM

## 2023-08-11 MED ORDER — BUDESONIDE DR - ER 3 MG CAPSULE,DELAYED,EXTENDED RELEASE
ORAL_CAPSULE | Freq: Every morning | ORAL | 0 refills | 30.00 days | Status: CP
Start: 2023-08-11 — End: 2023-11-09

## 2023-08-12 ENCOUNTER — Ambulatory Visit: Admit: 2023-08-12 | Discharge: 2023-08-13 | Payer: BLUE CROSS/BLUE SHIELD

## 2023-09-19 MED ORDER — LOPERAMIDE 2 MG CAPSULE
ORAL_CAPSULE | Freq: Four times a day (QID) | ORAL | 1 refills | 45.00 days | Status: CP | PRN
Start: 2023-09-19 — End: ?

## 2023-10-14 DIAGNOSIS — K51011 Ulcerative (chronic) pancolitis with rectal bleeding: Principal | ICD-10-CM

## 2023-10-14 MED ORDER — UPADACITINIB ER 30 MG TABLET,EXTENDED RELEASE 24 HR
ORAL_TABLET | Freq: Every day | ORAL | 0 refills | 90.00000 days | Status: CP
Start: 2023-10-14 — End: ?

## 2023-10-25 MED ORDER — DIPHENOXYLATE-ATROPINE 2.5 MG-0.025 MG TABLET
ORAL_TABLET | Freq: Four times a day (QID) | ORAL | 0 refills | 30.00000 days | Status: CP
Start: 2023-10-25 — End: 2023-11-24

## 2023-11-04 ENCOUNTER — Ambulatory Visit: Admit: 2023-11-04 | Discharge: 2023-11-05 | Payer: BLUE CROSS/BLUE SHIELD

## 2023-11-09 DIAGNOSIS — Z79899 Other long term (current) drug therapy: Principal | ICD-10-CM

## 2023-11-09 DIAGNOSIS — D84821 Immunosuppression due to drug therapy: Principal | ICD-10-CM

## 2023-11-27 MED ORDER — LOPERAMIDE 2 MG CAPSULE
ORAL_CAPSULE | 0 refills | 0.00000 days
Start: 2023-11-27 — End: ?

## 2023-11-27 MED ORDER — DIPHENOXYLATE-ATROPINE 2.5 MG-0.025 MG TABLET
ORAL_TABLET | 0 refills | 0.00000 days
Start: 2023-11-27 — End: ?

## 2023-11-28 MED ORDER — DIPHENOXYLATE-ATROPINE 2.5 MG-0.025 MG TABLET
ORAL_TABLET | ORAL | 0 refills | 0.00000 days | Status: CP
Start: 2023-11-28 — End: ?

## 2023-11-28 MED ORDER — LOPERAMIDE 2 MG CAPSULE
ORAL_CAPSULE | ORAL | 0 refills | 0.00000 days | Status: CP
Start: 2023-11-28 — End: ?

## 2023-12-04 MED ORDER — LOPERAMIDE 2 MG CAPSULE
ORAL_CAPSULE | 0 refills | 0.00000 days
Start: 2023-12-04 — End: ?

## 2023-12-05 MED ORDER — LOPERAMIDE 2 MG CAPSULE
ORAL_CAPSULE | Freq: Three times a day (TID) | ORAL | 1 refills | 90.00000 days | Status: CP
Start: 2023-12-05 — End: ?

## 2023-12-14 DIAGNOSIS — K5100 Ulcerative (chronic) pancolitis without complications: Principal | ICD-10-CM

## 2024-01-04 MED ORDER — LOPERAMIDE 2 MG TABLET
ORAL_TABLET | Freq: Two times a day (BID) | ORAL | 11 refills | 30.00000 days | Status: CP
Start: 2024-01-04 — End: 2025-01-03

## 2024-01-04 MED ORDER — LOPERAMIDE 2 MG CAPSULE
ORAL_CAPSULE | Freq: Four times a day (QID) | ORAL | 5 refills | 30.00000 days | Status: CP
Start: 2024-01-04 — End: ?

## 2024-01-05 MED ORDER — LOPERAMIDE 2 MG TABLET
ORAL_TABLET | Freq: Two times a day (BID) | ORAL | 11 refills | 30.00000 days | Status: CP
Start: 2024-01-05 — End: 2025-01-04

## 2024-01-17 DIAGNOSIS — K51011 Ulcerative (chronic) pancolitis with rectal bleeding: Principal | ICD-10-CM

## 2024-01-17 MED ORDER — UPADACITINIB ER 30 MG TABLET,EXTENDED RELEASE 24 HR
ORAL_TABLET | Freq: Every day | ORAL | 0 refills | 90.00000 days | Status: CP
Start: 2024-01-17 — End: ?

## 2024-01-19 ENCOUNTER — Encounter
Admit: 2024-01-19 | Discharge: 2024-01-20 | Payer: BLUE CROSS/BLUE SHIELD | Attending: Gastroenterology | Primary: Gastroenterology

## 2024-01-19 ENCOUNTER — Ambulatory Visit: Admit: 2024-01-19 | Discharge: 2024-01-20 | Payer: BLUE CROSS/BLUE SHIELD

## 2024-01-19 DIAGNOSIS — D84821 Immunosuppression due to drug therapy (HHS-HCC): Principal | ICD-10-CM

## 2024-01-19 DIAGNOSIS — E611 Iron deficiency: Principal | ICD-10-CM

## 2024-01-19 DIAGNOSIS — Z79899 Other long term (current) drug therapy: Principal | ICD-10-CM

## 2024-01-19 DIAGNOSIS — E538 Deficiency of other specified B group vitamins: Principal | ICD-10-CM

## 2024-01-19 DIAGNOSIS — K51011 Ulcerative (chronic) pancolitis with rectal bleeding: Principal | ICD-10-CM

## 2024-01-19 DIAGNOSIS — E559 Vitamin D deficiency, unspecified: Principal | ICD-10-CM

## 2024-01-20 DIAGNOSIS — K51011 Ulcerative (chronic) pancolitis with rectal bleeding: Principal | ICD-10-CM

## 2024-01-20 MED ORDER — UPADACITINIB ER 30 MG TABLET,EXTENDED RELEASE 24 HR
ORAL_TABLET | Freq: Every day | ORAL | 0 refills | 90.00000 days | Status: CP
Start: 2024-01-20 — End: ?

## 2024-02-28 DIAGNOSIS — Z79899 Other long term (current) drug therapy: Principal | ICD-10-CM

## 2024-02-28 DIAGNOSIS — K51011 Ulcerative (chronic) pancolitis with rectal bleeding: Principal | ICD-10-CM

## 2024-03-06 MED ORDER — PEG 3350-ELECTROLYTES 236 GRAM-22.74 GRAM-6.74 GRAM-5.86 GRAM SOLUTION
0 refills | 0.00000 days | Status: CP
Start: 2024-03-06 — End: ?

## 2024-04-18 DIAGNOSIS — K51011 Ulcerative (chronic) pancolitis with rectal bleeding: Principal | ICD-10-CM

## 2024-04-18 DIAGNOSIS — Z79899 Other long term (current) drug therapy: Principal | ICD-10-CM

## 2024-04-18 MED ORDER — UPADACITINIB ER 30 MG TABLET,EXTENDED RELEASE 24 HR
ORAL_TABLET | Freq: Every day | ORAL | 0 refills | 90.00000 days | Status: CP
Start: 2024-04-18 — End: ?

## 2024-04-20 ENCOUNTER — Encounter
Admit: 2024-04-20 | Discharge: 2024-04-20 | Payer: BLUE CROSS/BLUE SHIELD | Attending: Student in an Organized Health Care Education/Training Program | Primary: Student in an Organized Health Care Education/Training Program

## 2024-04-20 ENCOUNTER — Inpatient Hospital Stay: Admit: 2024-04-20 | Discharge: 2024-04-20 | Payer: BLUE CROSS/BLUE SHIELD

## 2024-04-30 MED ORDER — DIPHENOXYLATE-ATROPINE 2.5 MG-0.025 MG TABLET
ORAL_TABLET | Freq: Four times a day (QID) | ORAL | 3 refills | 90.00000 days | Status: CP
Start: 2024-04-30 — End: 2025-04-30

## 2024-05-08 DIAGNOSIS — K5100 Ulcerative (chronic) pancolitis without complications: Principal | ICD-10-CM

## 2024-05-28 DIAGNOSIS — E559 Vitamin D deficiency, unspecified: Principal | ICD-10-CM

## 2024-05-28 DIAGNOSIS — K5100 Ulcerative (chronic) pancolitis without complications: Principal | ICD-10-CM

## 2024-05-28 DIAGNOSIS — Z23 Encounter for immunization: Principal | ICD-10-CM

## 2024-05-28 DIAGNOSIS — E785 Hyperlipidemia, unspecified: Principal | ICD-10-CM

## 2024-05-30 DIAGNOSIS — A0472 Enterocolitis due to Clostridium difficile, not specified as recurrent: Principal | ICD-10-CM

## 2024-05-30 MED ORDER — VANCOMYCIN 125 MG CAPSULE
ORAL_CAPSULE | Freq: Two times a day (BID) | ORAL | 0 refills | 90.00000 days | Status: CP
Start: 2024-05-30 — End: 2024-08-28

## 2024-06-26 DIAGNOSIS — K5100 Ulcerative (chronic) pancolitis without complications: Principal | ICD-10-CM

## 2024-06-26 DIAGNOSIS — Z792 Long term (current) use of antibiotics: Principal | ICD-10-CM

## 2024-06-28 ENCOUNTER — Encounter
Admit: 2024-06-28 | Discharge: 2024-06-29 | Payer: BLUE CROSS/BLUE SHIELD | Attending: Gastroenterology | Primary: Gastroenterology

## 2024-06-28 DIAGNOSIS — E559 Vitamin D deficiency, unspecified: Principal | ICD-10-CM

## 2024-06-28 DIAGNOSIS — K51011 Ulcerative (chronic) pancolitis with rectal bleeding: Principal | ICD-10-CM

## 2024-06-28 DIAGNOSIS — D84821 Immunosuppression due to drug therapy (HHS-HCC): Principal | ICD-10-CM

## 2024-06-28 DIAGNOSIS — E611 Iron deficiency: Principal | ICD-10-CM

## 2024-06-28 DIAGNOSIS — A0472 Enterocolitis due to Clostridium difficile, not specified as recurrent: Principal | ICD-10-CM

## 2024-06-28 MED ORDER — LOPERAMIDE 2 MG CAPSULE
ORAL_CAPSULE | Freq: Four times a day (QID) | ORAL | 0 refills | 30.00000 days | Status: CP
Start: 2024-06-28 — End: 2024-07-28

## 2024-06-28 MED ORDER — UPADACITINIB ER 30 MG TABLET,EXTENDED RELEASE 24 HR
ORAL_TABLET | Freq: Every day | ORAL | 3 refills | 90.00000 days | Status: CP
Start: 2024-06-28 — End: 2025-06-28
